# Patient Record
Sex: Male | Born: 1960 | Hispanic: Yes | Marital: Single | Smoking: Never smoker
Health system: Southern US, Community
[De-identification: ages and names within clinical notes are randomized; demographics above are authoritative.]

## PROBLEM LIST (undated history)

## (undated) DIAGNOSIS — E1110 Type 2 diabetes mellitus with ketoacidosis without coma: Secondary | ICD-10-CM

## (undated) DIAGNOSIS — I10 Essential (primary) hypertension: Secondary | ICD-10-CM

## (undated) DIAGNOSIS — S98112A Complete traumatic amputation of left great toe, initial encounter: Secondary | ICD-10-CM

## (undated) DIAGNOSIS — S88112A Complete traumatic amputation at level between knee and ankle, left lower leg, initial encounter: Secondary | ICD-10-CM

## (undated) DIAGNOSIS — S78112A Complete traumatic amputation at level between left hip and knee, initial encounter: Secondary | ICD-10-CM

## (undated) HISTORY — DX: Complete traumatic amputation of left great toe, initial encounter: S98.112A

## (undated) HISTORY — DX: Essential (primary) hypertension: I10

## (undated) HISTORY — DX: Type 2 diabetes mellitus with ketoacidosis without coma: E11.10

## (undated) HISTORY — DX: Complete traumatic amputation at level between knee and ankle, left lower leg, initial encounter: S88.112A

## (undated) HISTORY — DX: Complete traumatic amputation at level between left hip and knee, initial encounter: S78.112A

---

## 2010-06-10 DIAGNOSIS — E785 Hyperlipidemia, unspecified: Secondary | ICD-10-CM | POA: Insufficient documentation

## 2013-11-15 IMAGING — US US RUQ
1 series · 14 of 25 positions shown · non-contrast
Comparison: none

INDICATION: Right upper quadrant pain

[Series 1: us ruq · 14 of 37 slices shown]
[im 1/37]
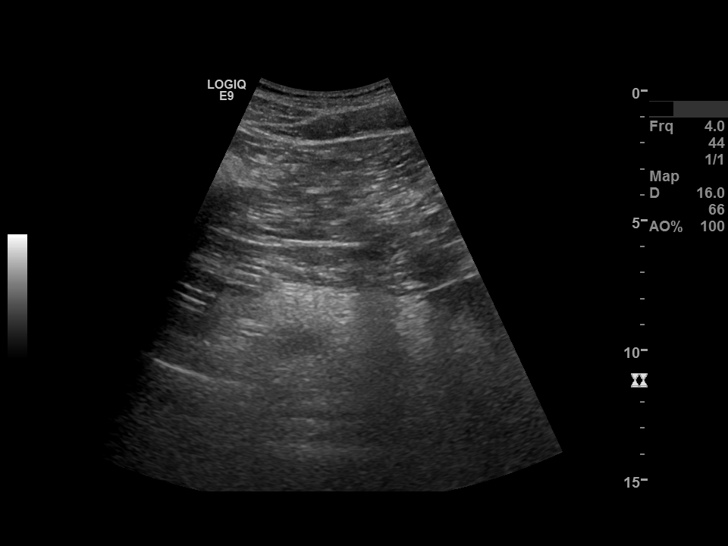
[im 4/37]
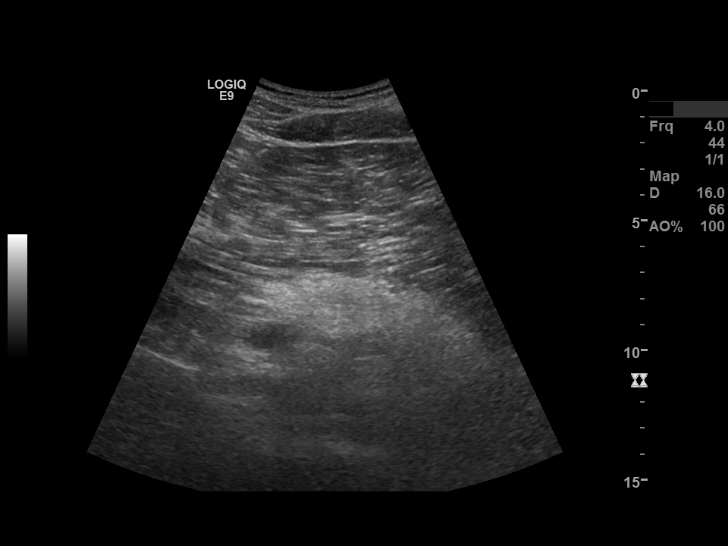
[im 7/37]
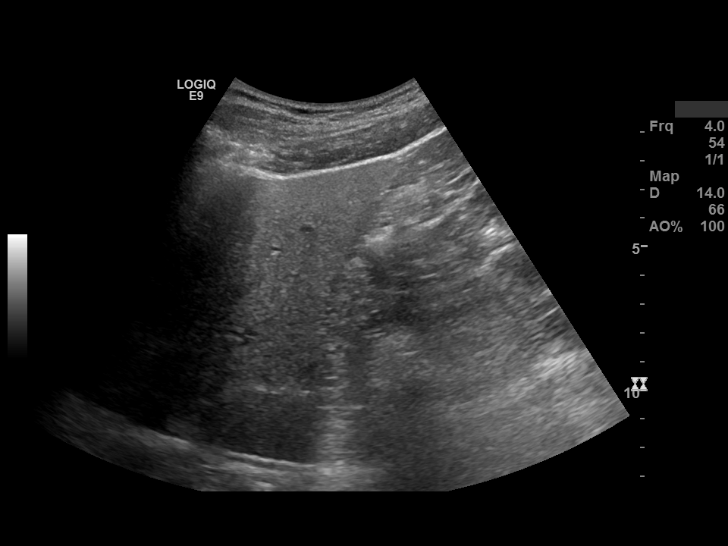
[im 10/37]
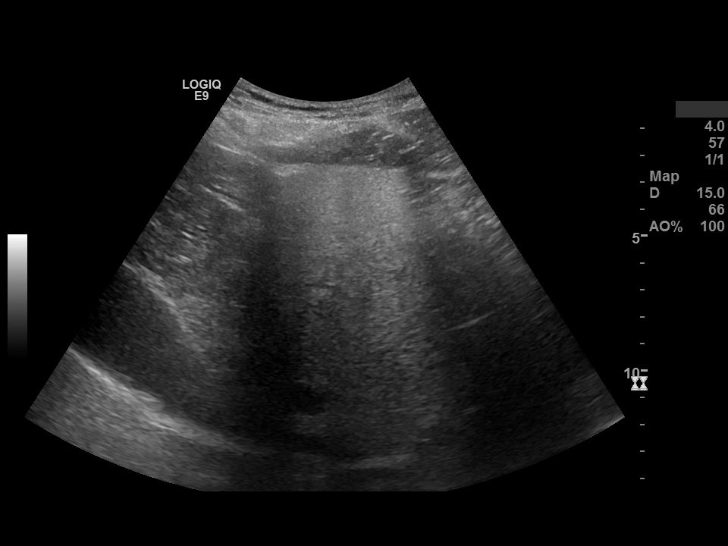
[im 13/37]
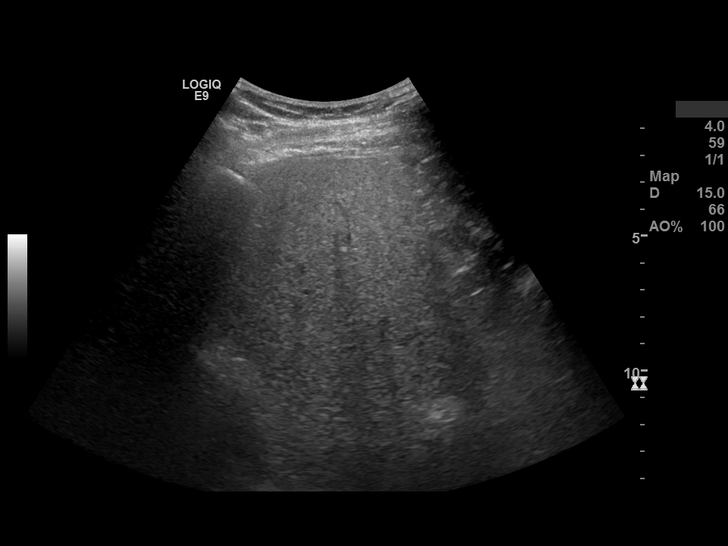
[im 14/37]
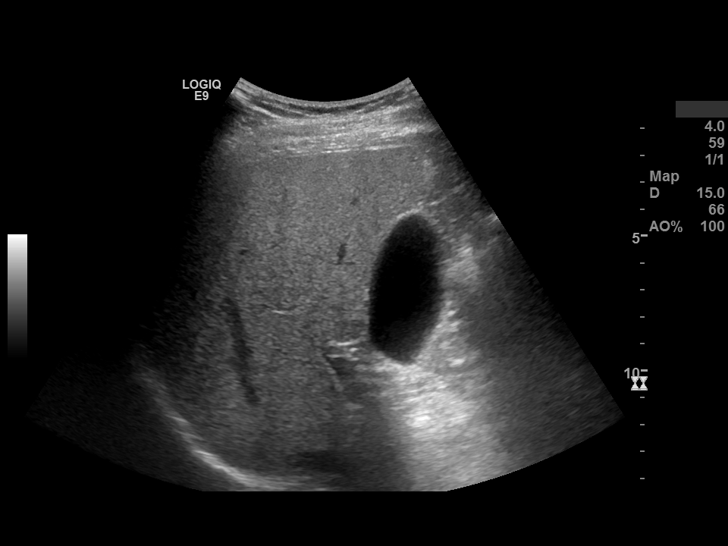
[im 17/37]
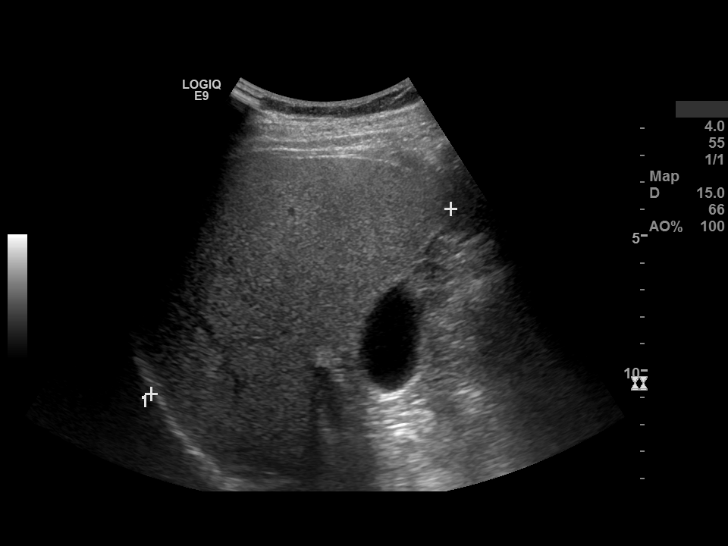
[im 20/37]
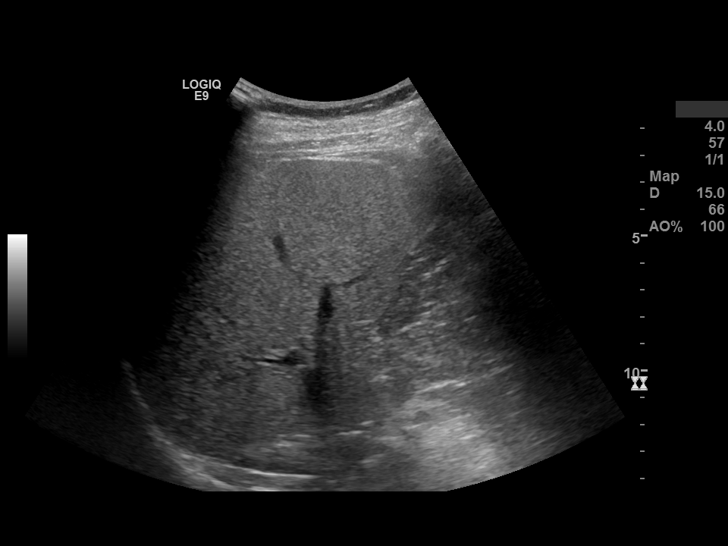
[im 23/37]
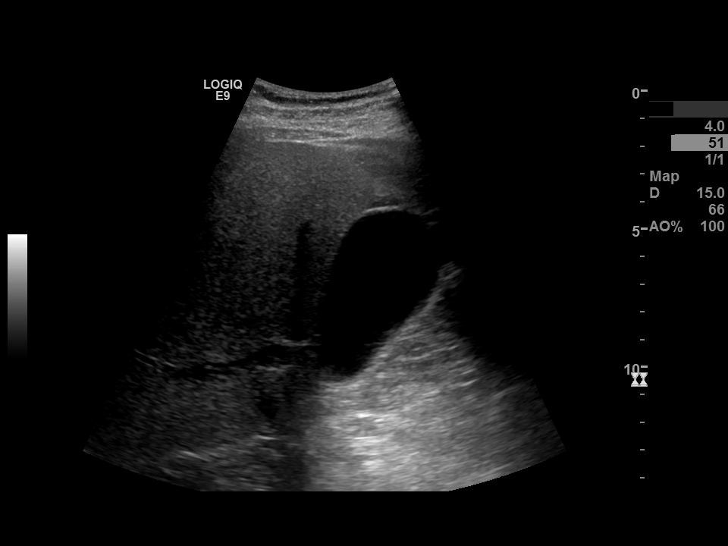
[im 25/37]
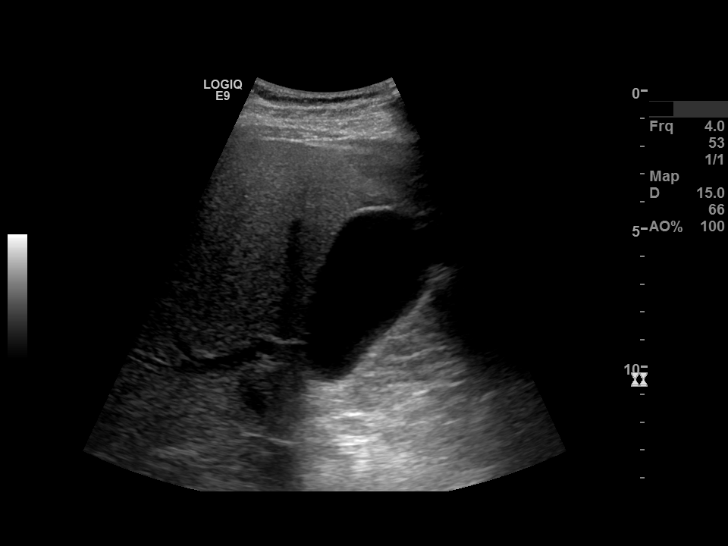
[im 28/37]
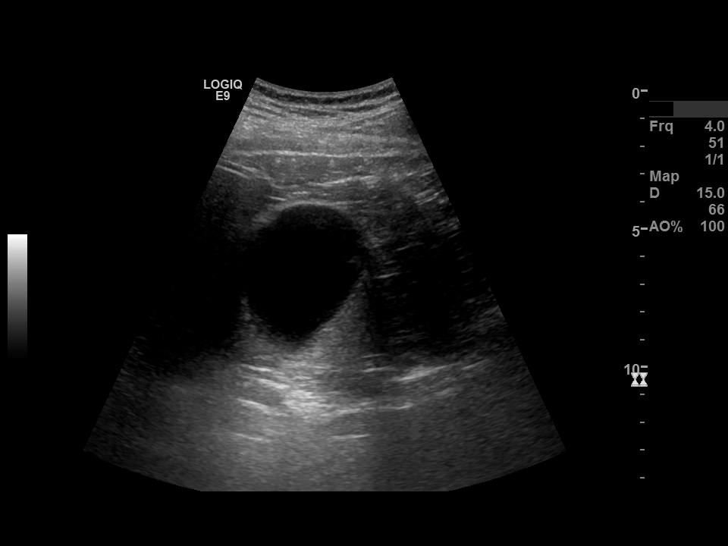
[im 31/37]
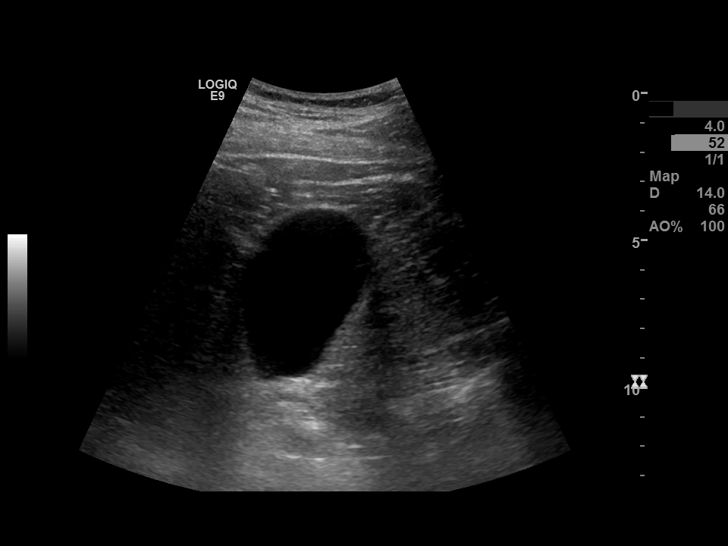
[im 34/37]
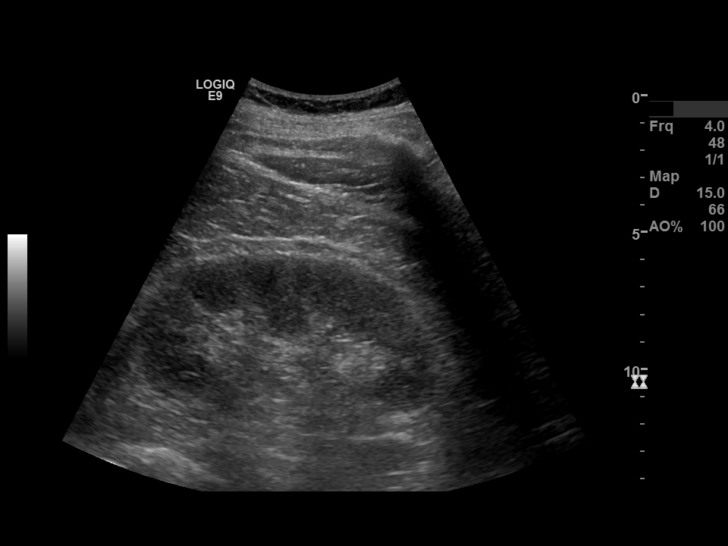
[im 37/37]
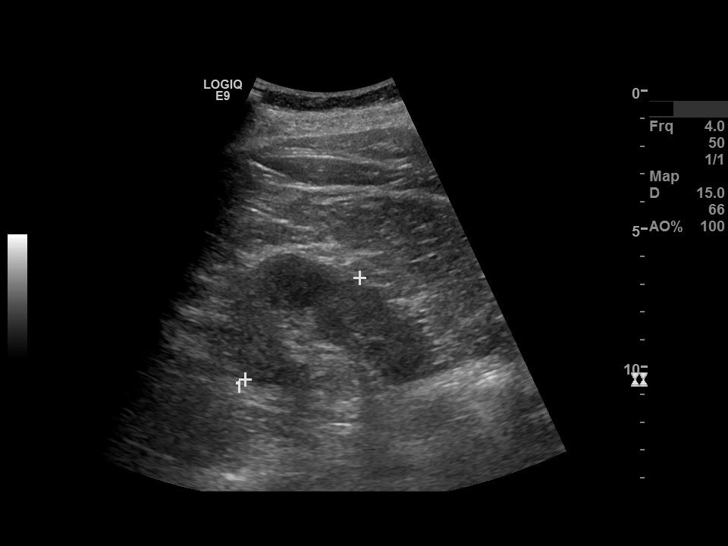

[14 of 25 positions shown; findings below may reference images not displayed]

FINDINGS: Ultrasound examination of the gallbladder shows no gallstones, gallbladder wall thickening or pericholecystic fluid. Common bile duct is normal in diameter at 5 mm. There is increased echogenicity within the liver consistent with hepatic steatosis. No focal liver lesion is demonstrated. There is no right hydronephrosis.
IMPRESSION: 1. Normal-appearing gallbladder

2. Hepatic steatosis

## 2019-01-11 IMAGING — CR HIP BIL 5 VWS W/PELVIS
2 series · 5 of 5 positions shown · non-contrast
Comparison: none

Pelvis and bilateral hips 5 views
INDICATION: Pain in hip.

[Series 1: ap · 0.17mm/px · 4 of 4 slices shown (1 of 2)]
[im 1/4]
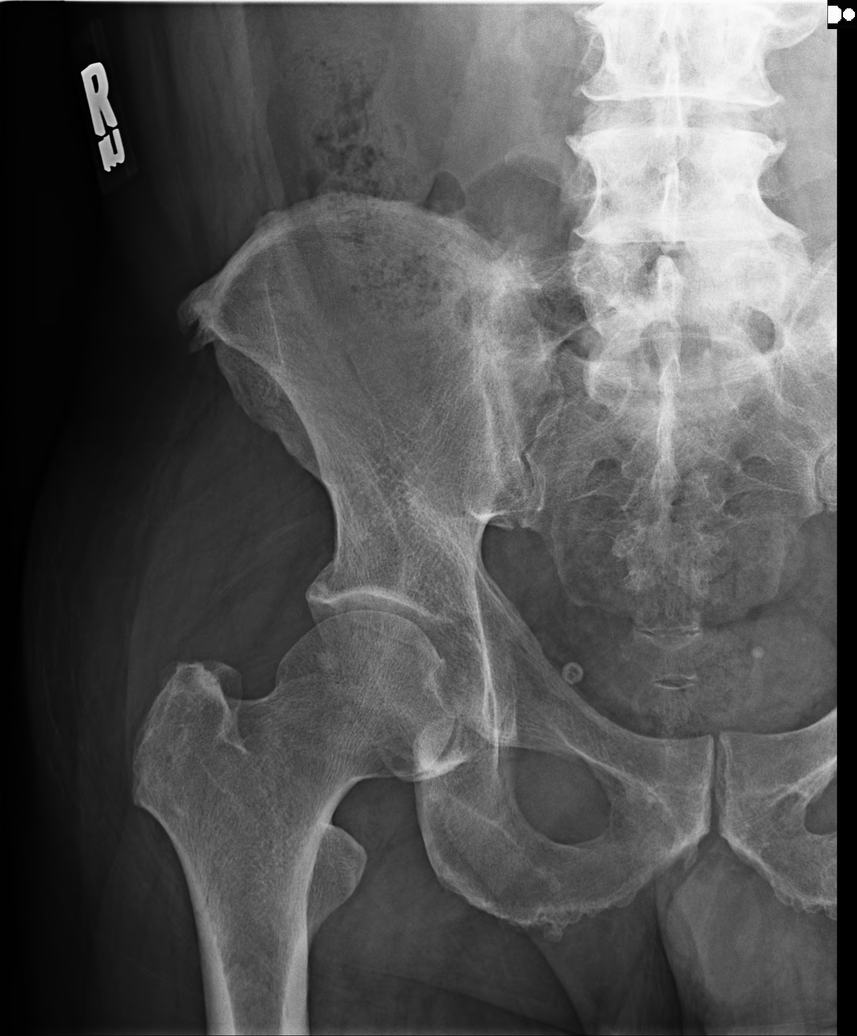
[im 2/4]
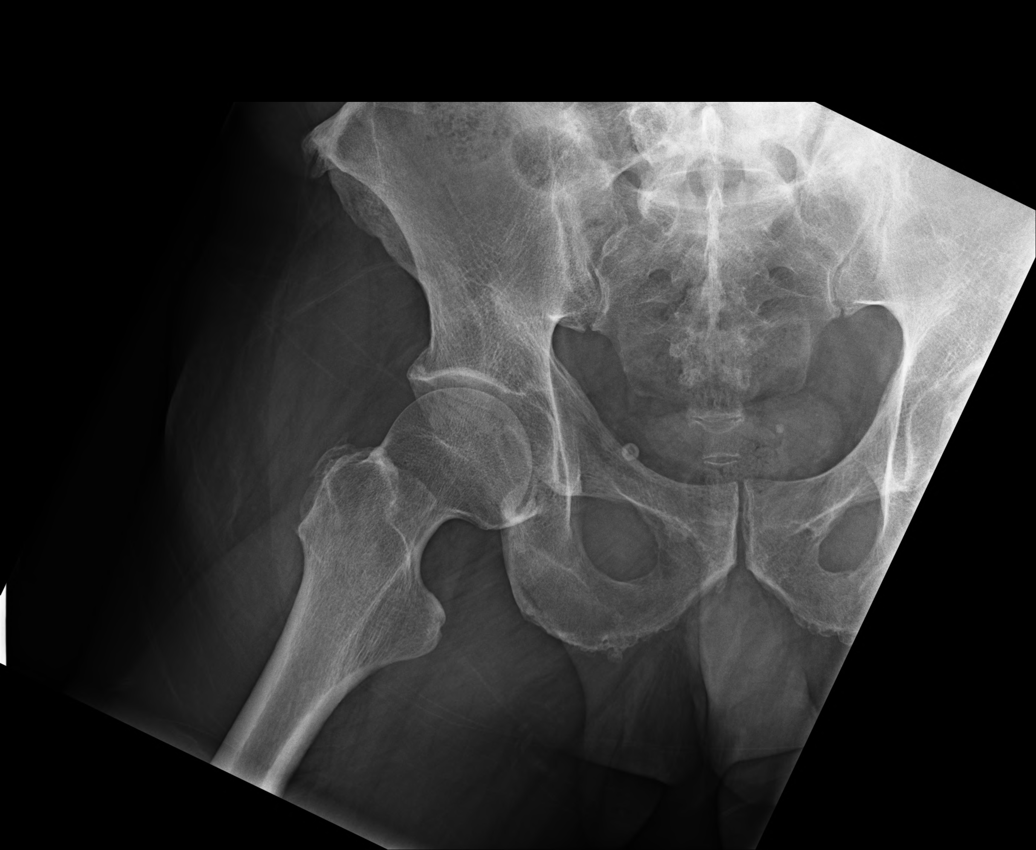
[im 3/4]
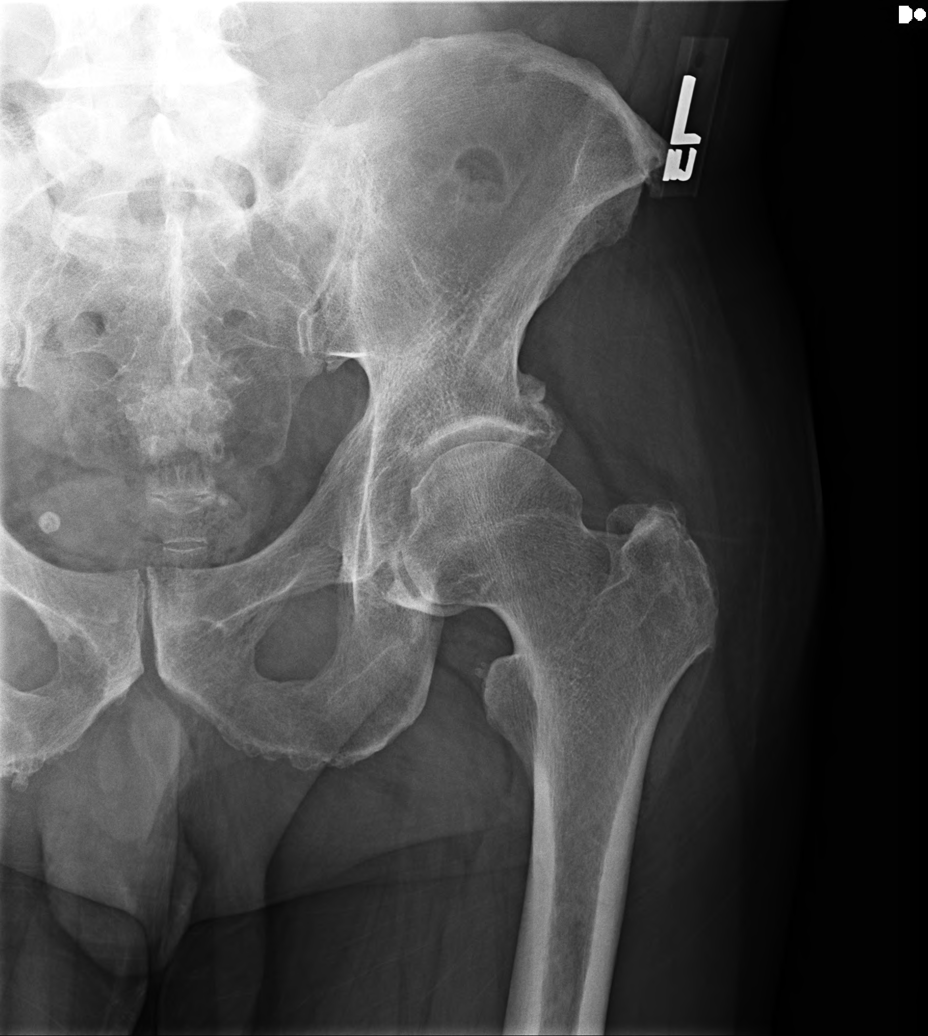
[im 4/4]
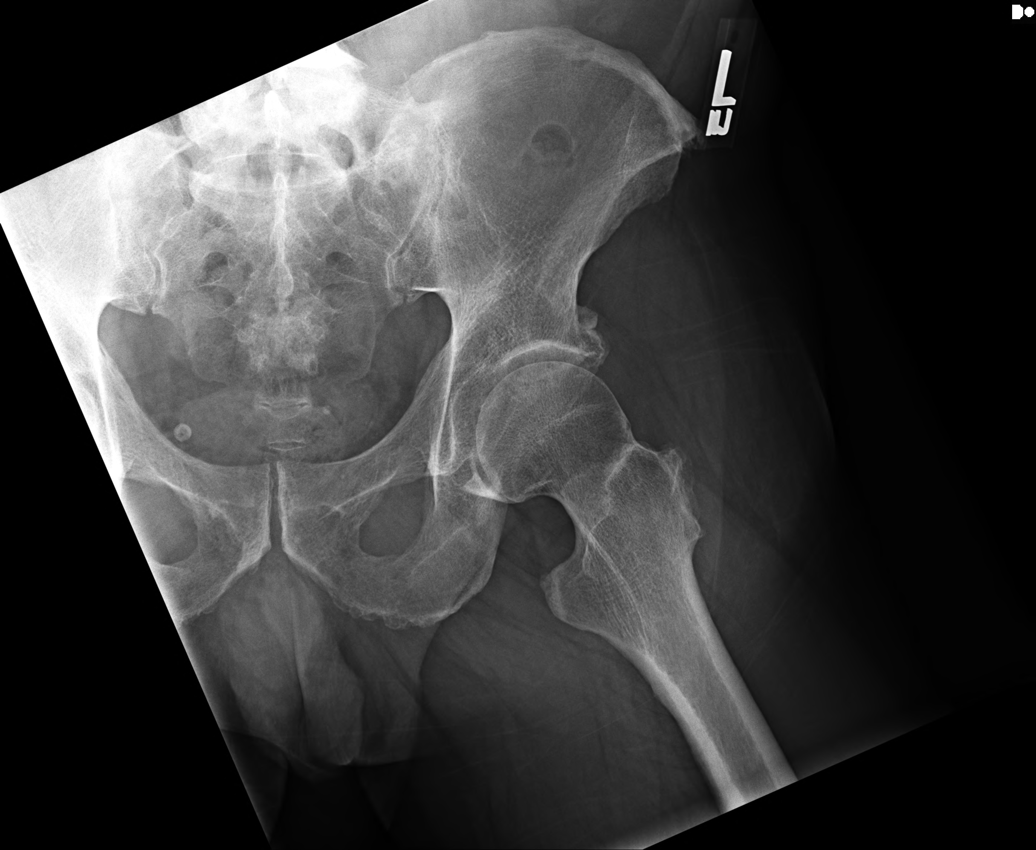

[ap (2 of 2)]
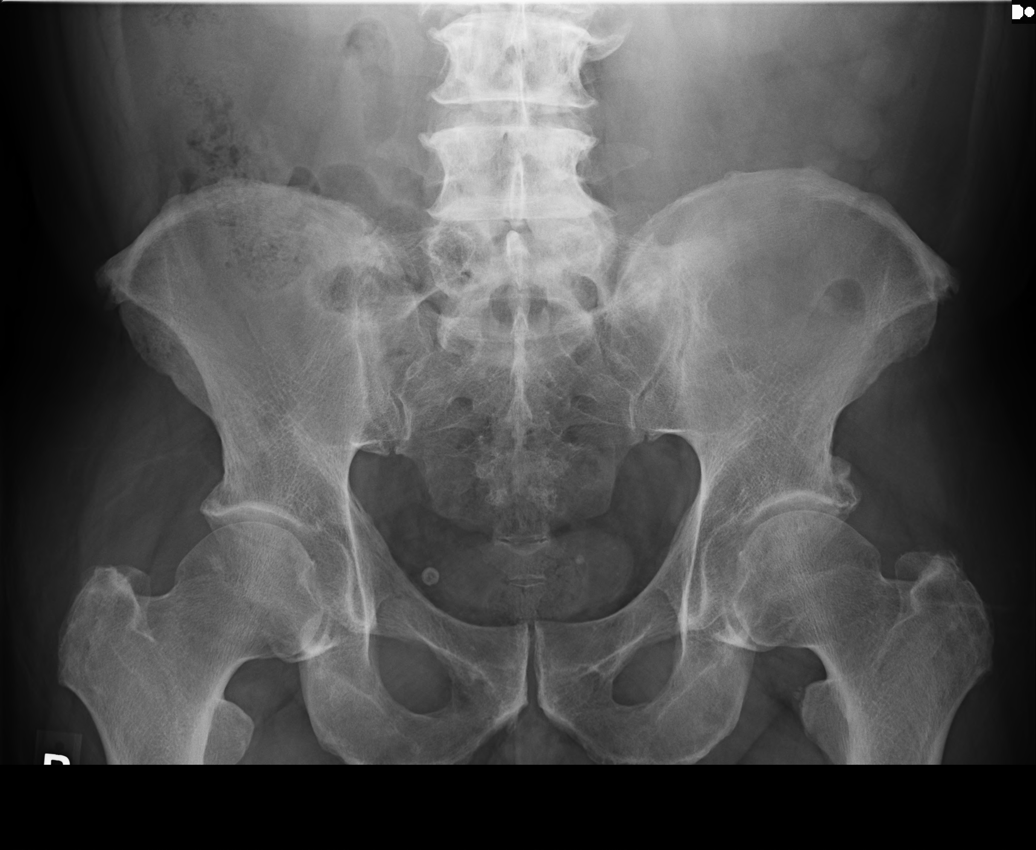

[5 of 5 positions shown; findings below may reference images not displayed]

FINDINGS: There are bilateral mild inferior acetabular spurs. Tiny bilateral areas of upper SI joint partial fusions.

Bone mineralization otherwise unremarkable. Moderate lumbar degenerative change.
IMPRESSION: Mild bilateral hip degenerative change. Tiny areas of partial SI joint fusions bilaterally.

STAT Fax

## 2020-06-18 IMAGING — US US ABDOMEN COMPLETE
1 series · 13 of 25 positions shown · non-contrast
Comparison: Previous right upper quadrant ultrasound 11/15/2013.

HISTORY: 59-year-old male, elevated blood work.
TECHNIQUE: Abdominal ultrasound.

[Series 1: us abdomen complete · 13 of 62 slices shown]
[im 1/62]
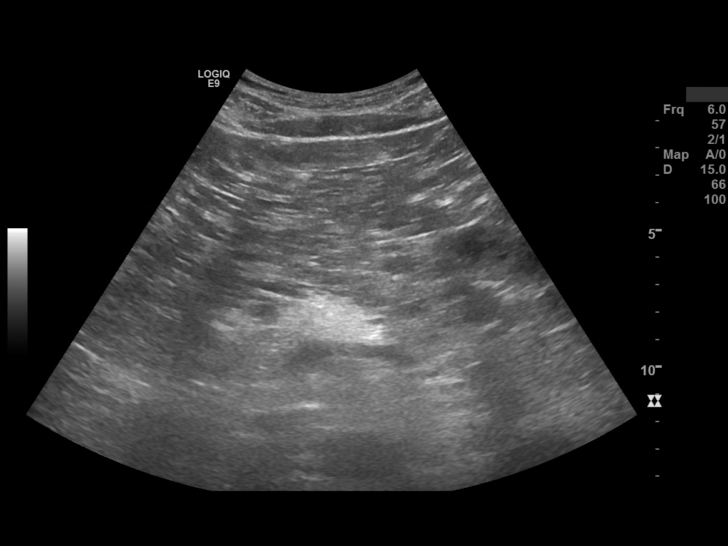
[im 6/62]
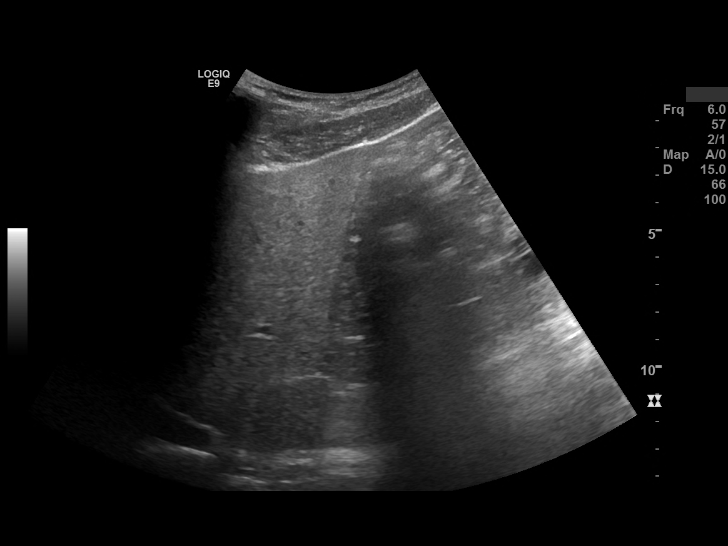
[im 11/62]
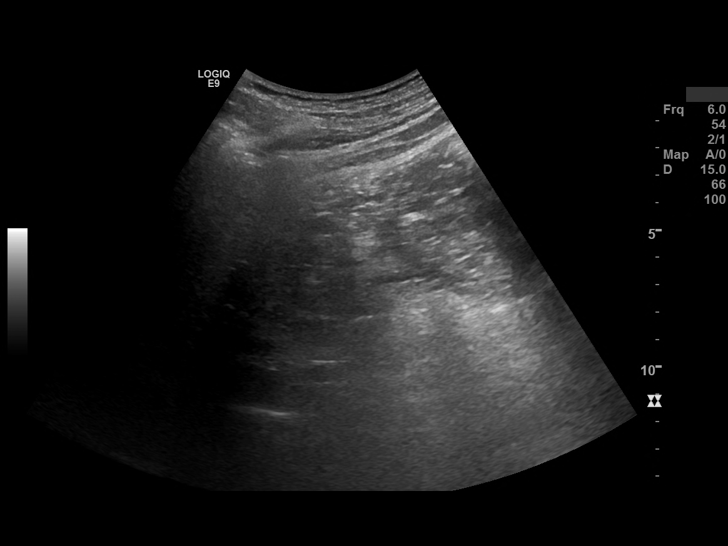
[im 16/62]
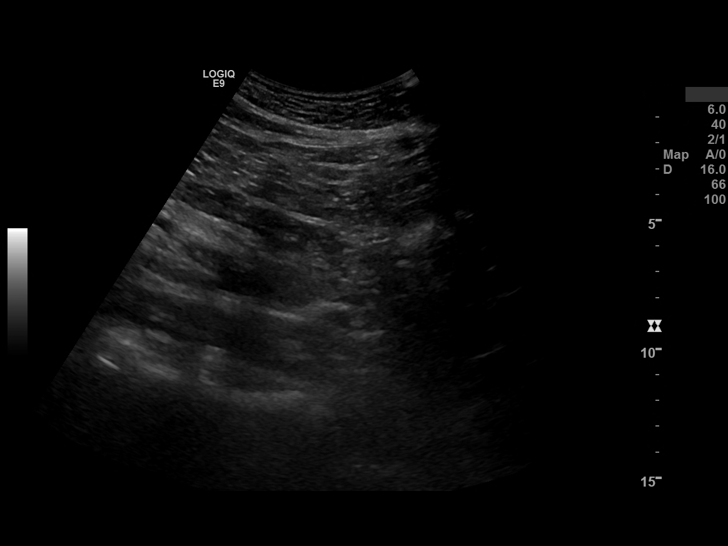
[im 21/62]
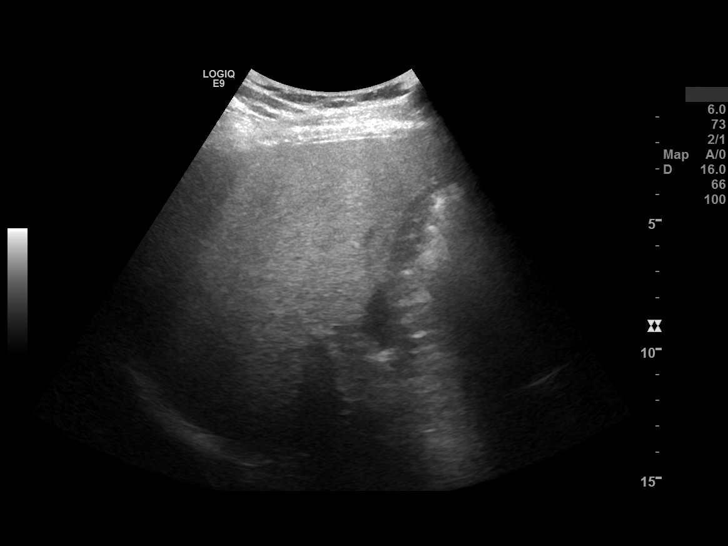
[im 26/62]
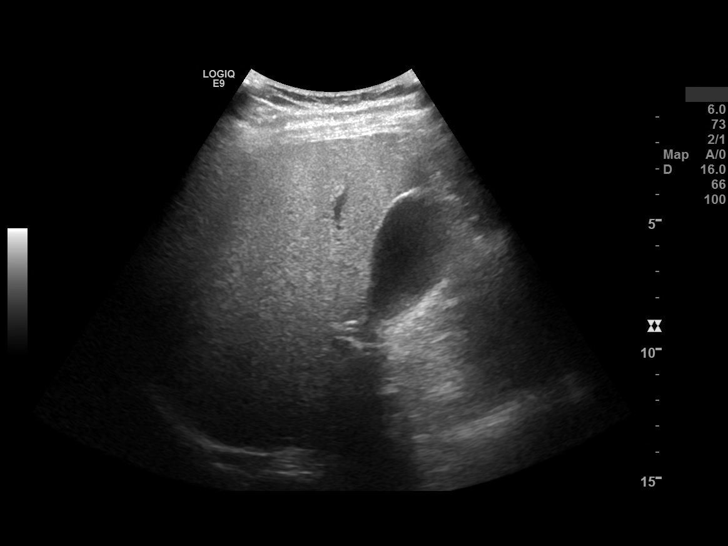
[im 31/62]
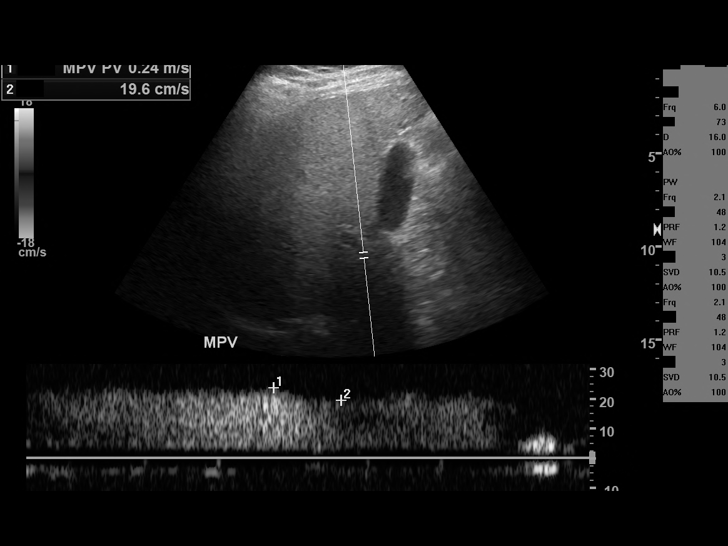
[im 36/62]
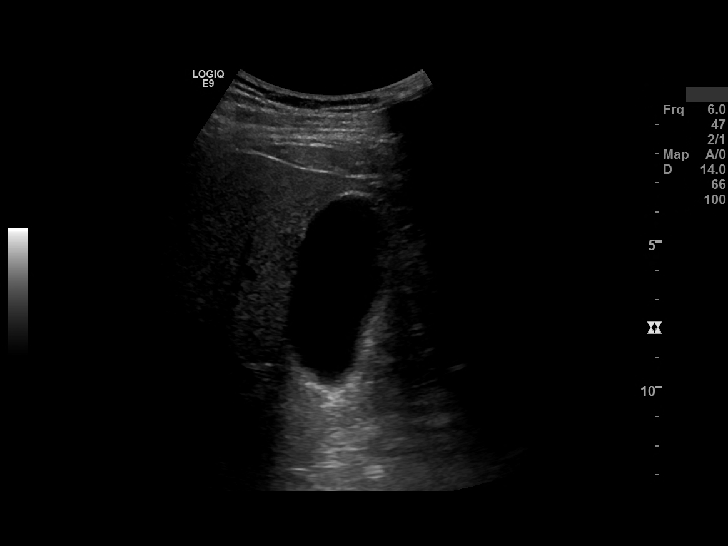
[im 41/62]
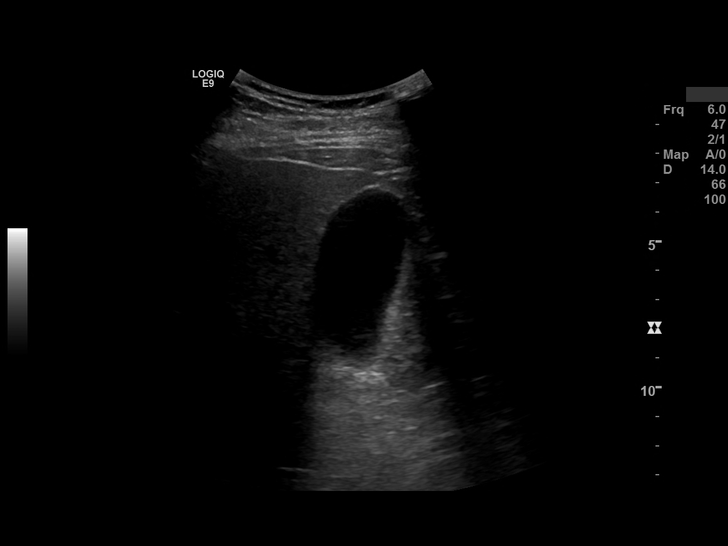
[im 46/62]
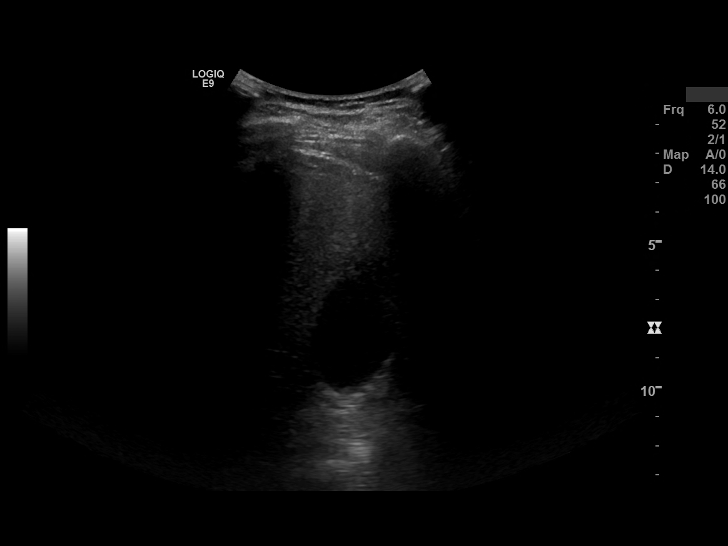
[im 51/62]
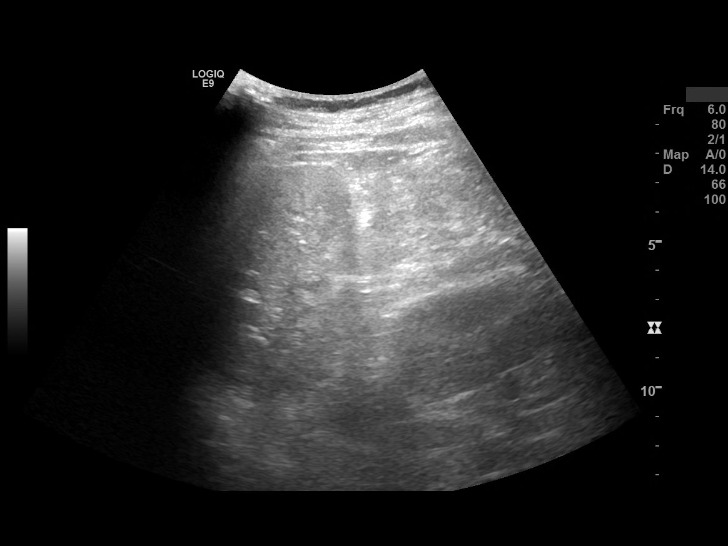
[im 56/62]
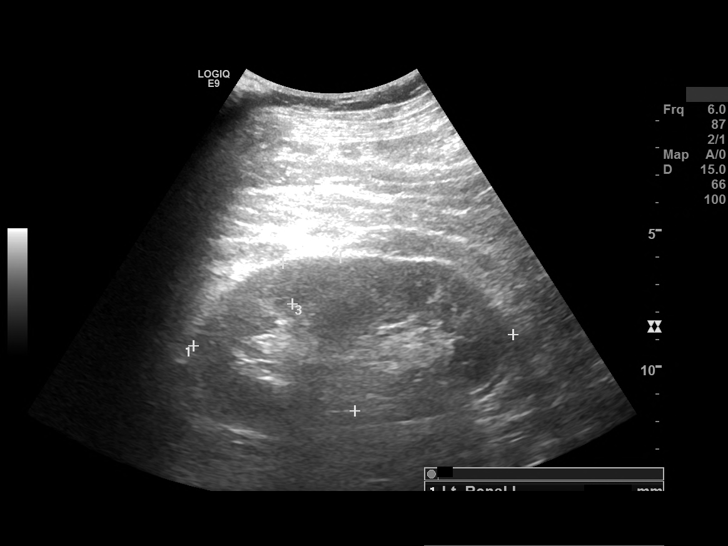
[im 62/62]
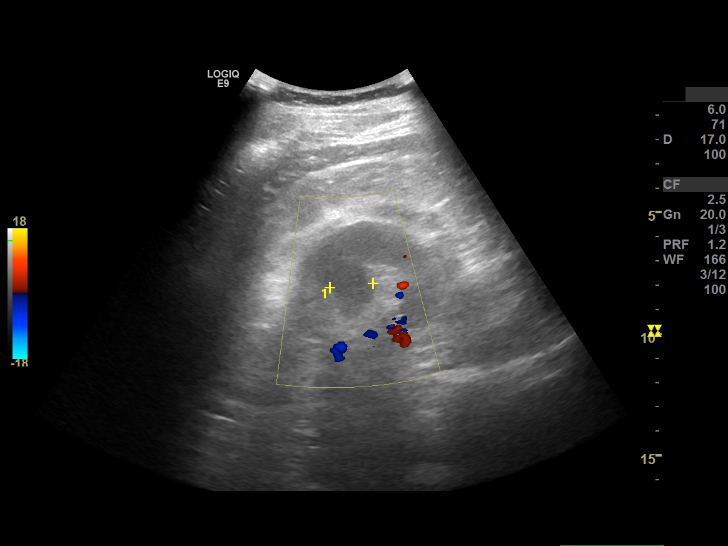

[13 of 25 positions shown; findings below may reference images not displayed]

FINDINGS: Right lobe of liver 145 mm in length. Liver echogenicity is moderately echo bright compared to kidney. Echodense linear surface. No liver mass, no ascites.

IVC patent.

Portal vein flow hepatopetal measuring 6.4 mm. Flow rate 0.24 m/s.

Gallbladder shows no stones or sludge.

Gallbladder wall normal 1.7 mm. No pericholecystic fluid.

Common duct normal 5.7 mm.

Abdominal aorta shows no aneurysm.

The visualized portions of pancreas normal but mostly obscured by bowel gas and not seen well.

Spleen is 80 x 51 x 37 mm. Appears normal.

Right kidney 110 x 54 x 91 mm,  no hydronephrosis.

Left kidney 117 x 56 x 93 mm. Slightly lobular surface, prominent pyramid region as well is likely a prominent column of 13 column of 13, 13 13 x 13 about 18 x 18 mm. However, correlation should be made with renal CT.
IMPRESSION: 1. No gallstones, no duct dilatation.

2. Liver is echogenic echo bright likely cyst, there is a mild degree of steatosis/steatohepatitis.

3. Left kidney area with focal lobulation of 18 mm is likely a column of Bertin, recommend CT of kidneys pre- and postcontrast. 

4. Other details described as above.

## 2020-06-18 IMAGING — CT CTA HEAD/NECK WO/W CONTRAST
2 of 16 series · 8 of 33 positions shown · IV contrast (APPLIED)
Comparison: None.

HISTORY: 59-year-old male with elevated alkaline phosphate level, elevated ALT measurement, essential hypertension and mixed hyperlipidemia.
TECHNIQUE: CT angiography was obtained from the aortic arch to the top of the skull first without contrast followed by the intravenous administration of nonionic contrast material. Coronal, sagittal, and bilateral oblique reconstructions were obtained.  Multiplanar MIP reconstructions were created. 2-D and 3-D reconstructed images also obtained. Post-processing software generated rotating 3-D and MIP images performed under concurrent physician supervision.

IV contrast: 80 mL 3sovue-Y11. Serum creatinine is 0.9 mg/dL by i-STAT.

[Series 11: angio neck · axial · 0.53mm/px · z∈[-368,-138]mm · 4 of 386 slices shown]
[im 78/386  soft-tissue]
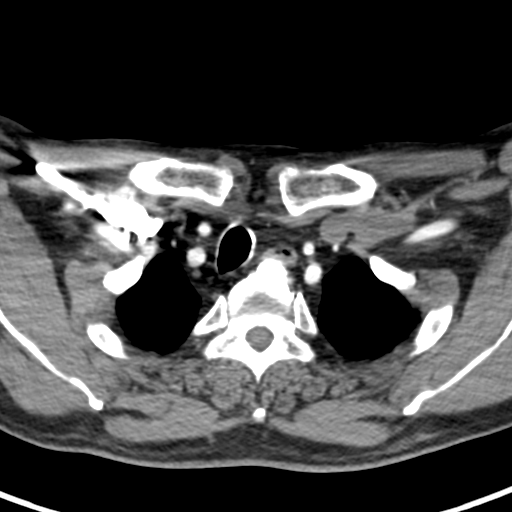
[im 155/386  bone]
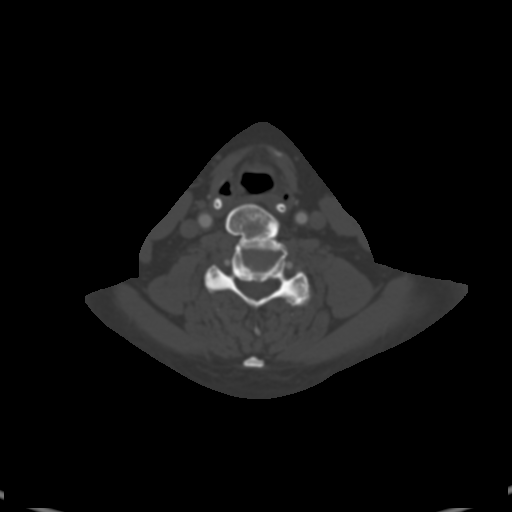
[im 232/386  soft-tissue]
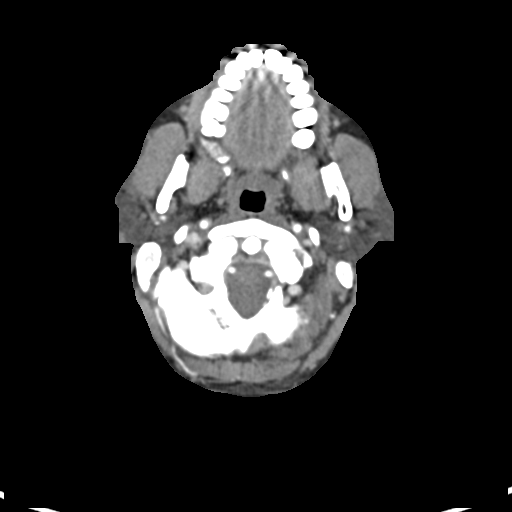
[im 309/386  bone]
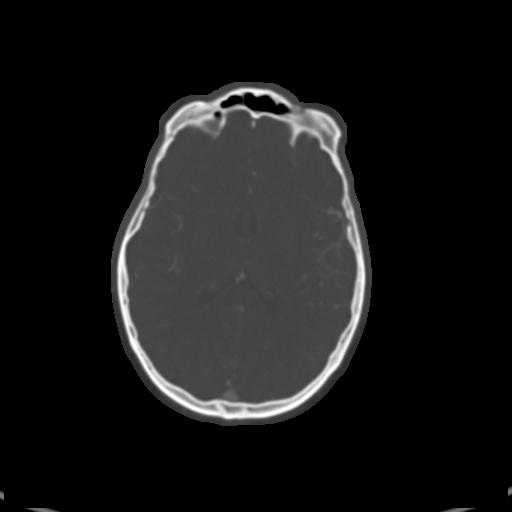

[Series 12: angio head · axial · 0.53mm/px · z∈[-192,-93]mm · 4 of 332 slices shown]
[im 67/332  soft-tissue]
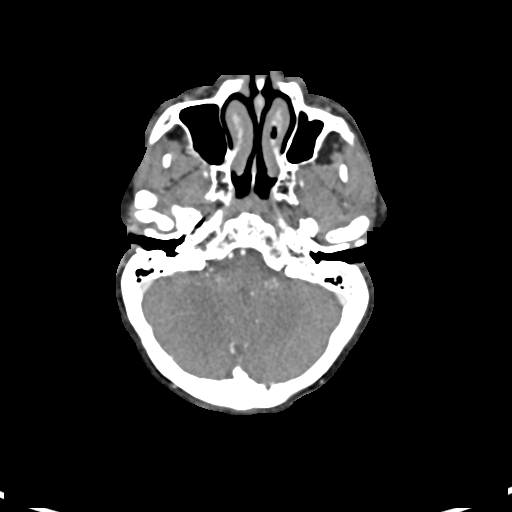
[im 133/332  soft-tissue]
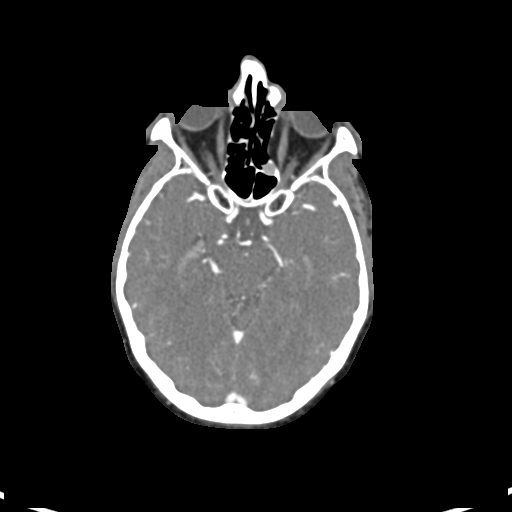
[im 199/332  soft-tissue]
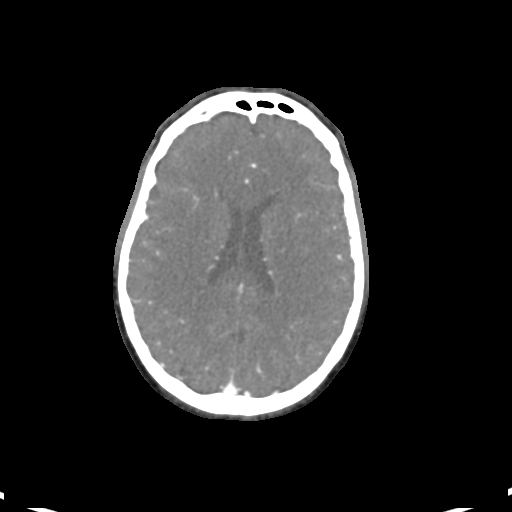
[im 265/332  soft-tissue]
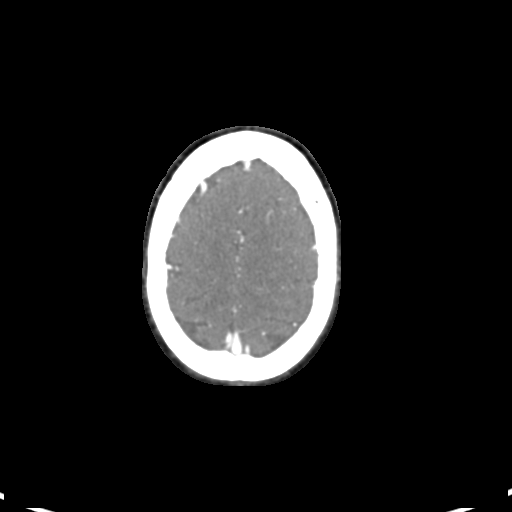

[8 of 33 positions shown; findings below may reference images not displayed]

FINDINGS: Aortic arch: The origins of the brachiocephalic, left common carotid  and the left subclavian arteries are patent.

Vertebral arteries: The right vertebral artery is patent.  The left vertebral artery is also patent.

Right common carotid arteriogram: The right common carotid and the right external carotid arteries are patent. There is mild stenosis involving the origin of the right internal carotid artery.

Left common carotid arteriogram: The left common carotid, left internal and the left external carotid arteries are also patent.

Visualized portion of the sinuses: Mild mucosal thickening is present in the ethmoid air cells.

Thyroid and the salivary glands: The thyroid gland is unremarkable. The parotid, submandibular glands are also within normal limits.

Lymph nodes: Nonspecific bilateral jugulodigastric and bilateral submandibular lymph nodes are present.

Cervical spine: Thickened bridging osteophytes are present throughout the cervical spine from C3 to C7 levels. Broad-based anterior bridging osteophytes are also present at C2-3 and T1-2 levels.

Larynx: The larynx is unremarkable.

Mandible: The visualized portion mandible is unremarkable. 

Nasopharynx and oropharynx: The nasopharynx and the oropharynx are unremarkable.

Both lung apices are unremarkable.

2-D and 3-D reconstructed images confirmed the above findings.
IMPRESSION: 1. There is mild stenosis involving the origin of the right internal carotid artery. The remaining neck vessels are patent. DISH changes involving the cervical spine.

3. Mild mucosal thickening is present in the ethmoid air cells.

CT ANGIOGRAPHY STUDY OF THE HEAD.
FINDINGS: Petrous and cavernous portion of both internal carotid arteries: The petrous and cavernous portions of both internal carotid arteries are patent.

Right anterior and middle cerebral arteries: Patent.

Left anterior and middle cerebral arteries: Patent. 

Distal vertebral arteries: Patent.

Basilar artery and both posterior cerebral arteries: Patent.

Brain parenchyma: The brain parenchyma is within normal limits.

Bones and soft tissues are also unremarkable.

2-D and 3-D reconstructed images confirm the above findings.
IMPRESSION: 1. Patent major intracranial vessels.

2. Please note that aneurysms 5 mm or smaller may not be detected by this technique secondary to its limits of resolution.

Total radiation dose to patient is CTDIvol 113.55 mGy and DLP 2208.92 mGy-cm.

## 2020-06-19 IMAGING — CR SHOULDER RT 2-3 VWS
1 series · 3 of 3 positions shown · non-contrast
Comparison: Correlation made to concurrent right shoulder MRI.

INDICATION: Bilateral shoulder pain
TECHNIQUE: 3 views of the right shoulder obtained.

[Series 1: internal rotate · 0.17mm/px · 3 of 3 slices shown]
[im 1/3]
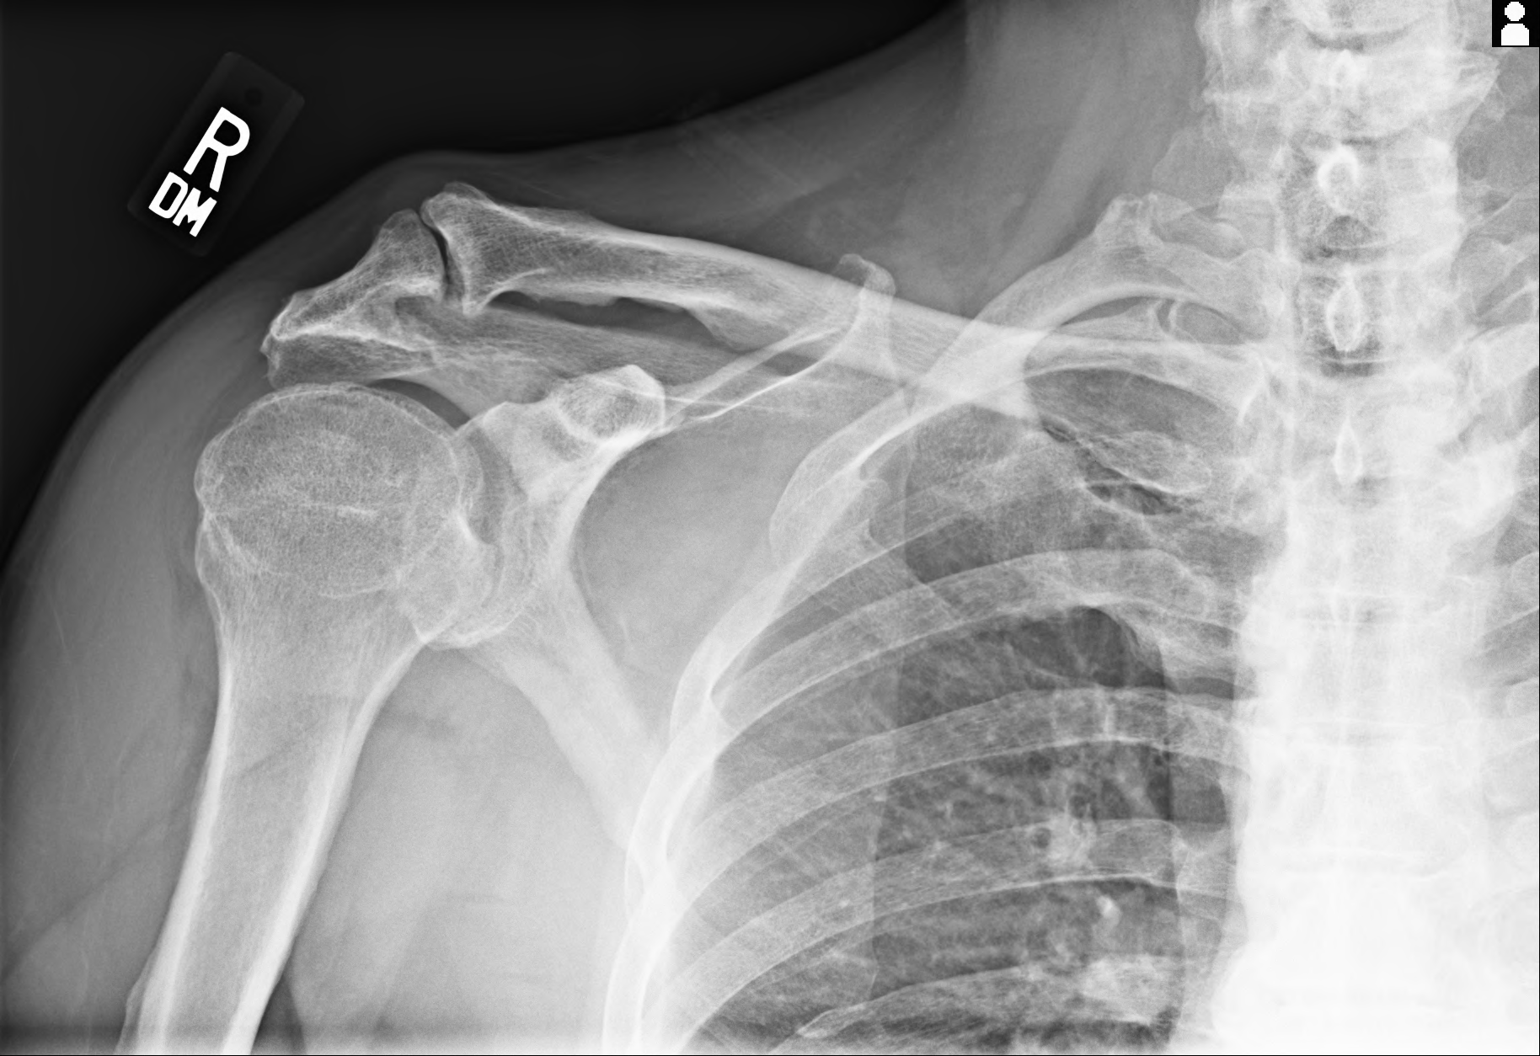
[im 2/3]
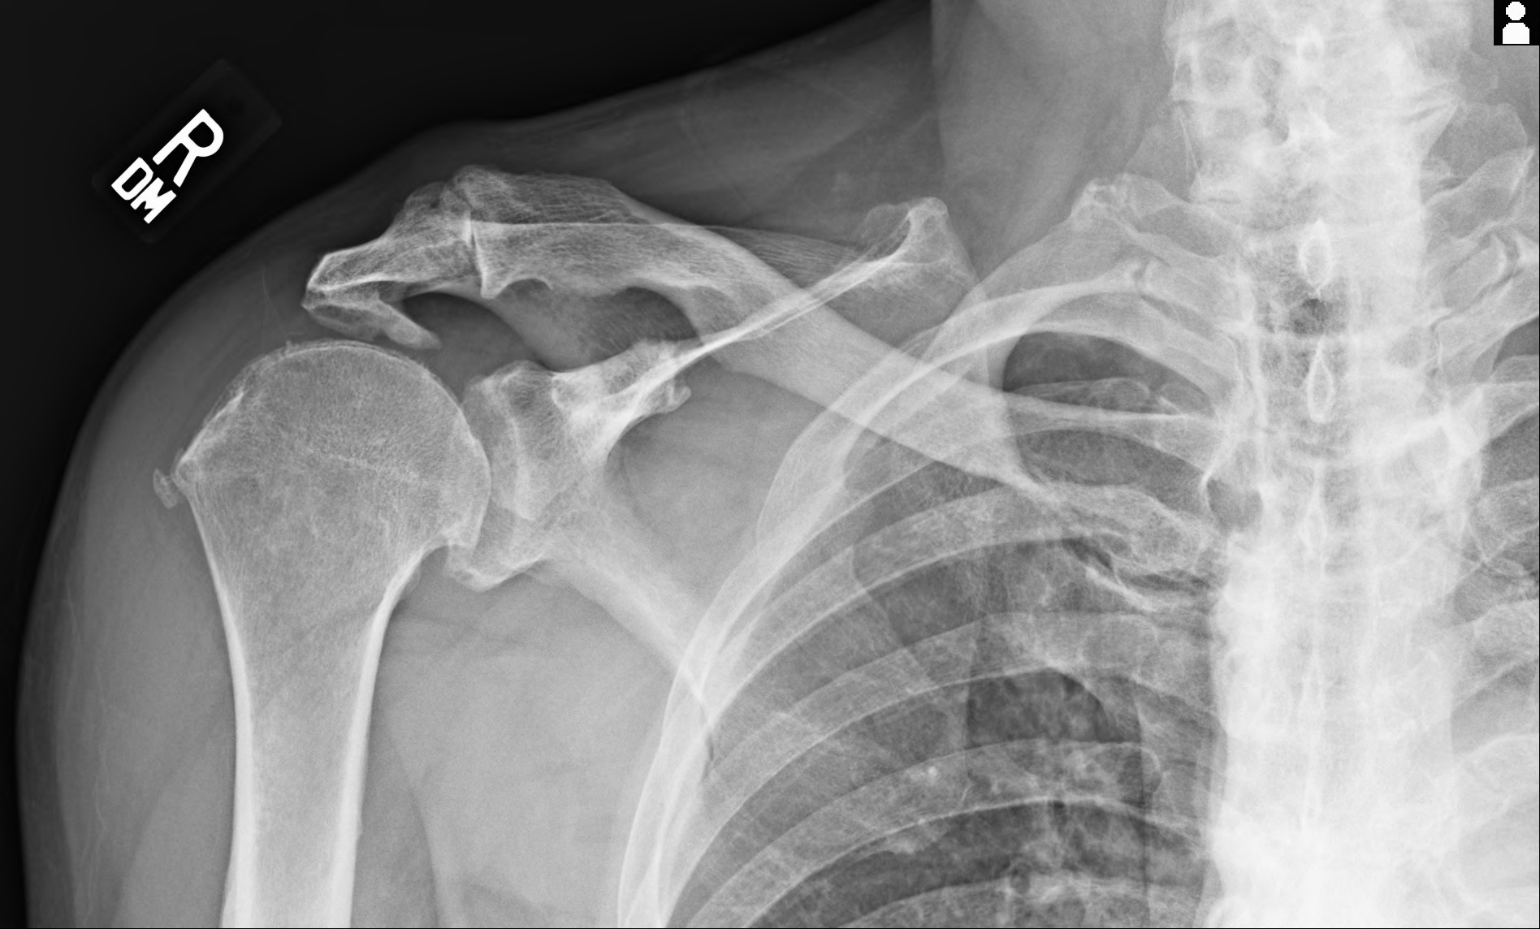
[im 3/3]
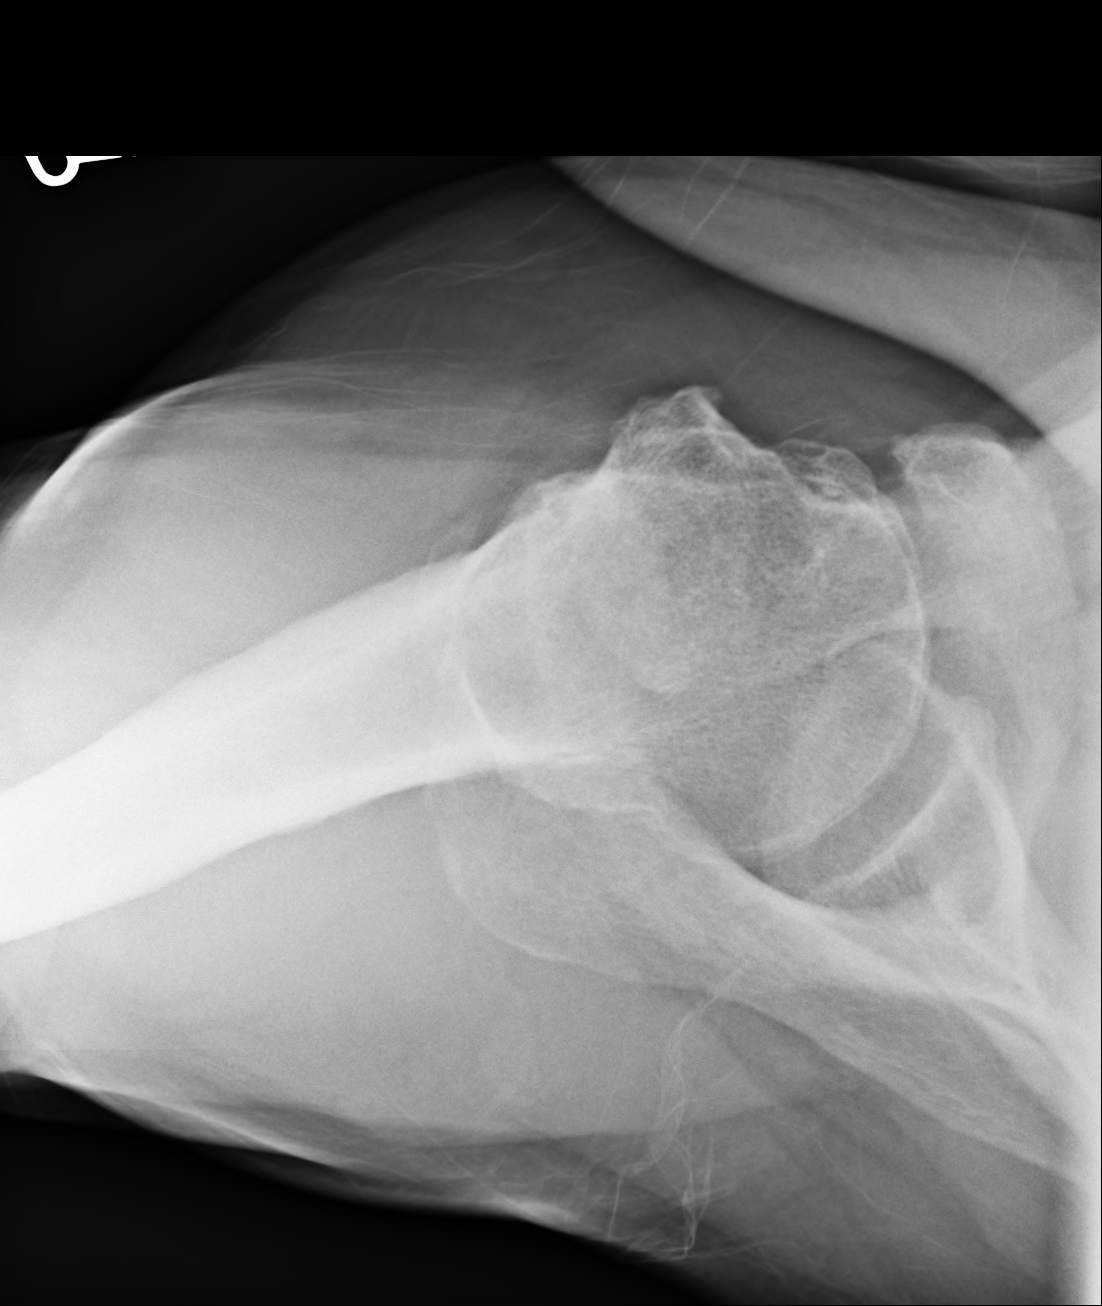

[3 of 3 positions shown; findings below may reference images not displayed]

FINDINGS: No acute fracture or dislocation. Moderate to severe acromioclavicular joint osteoarthritis. Large subacromial enthesophyte. Moderate glenohumeral joint osteoarthritis. Visualized portion of the right chest is unremarkable. Reactive changes are noted along the greater tuberosity, which can be seen in setting of rotator cuff pathology.
IMPRESSION: Moderate to severe acromioclavicular and moderate glenohumeral joint osteoarthritis.

## 2020-06-19 IMAGING — MR MRI SHOULDER LT WO CONTRAST
4 series · 29 of 40 positions shown · non-contrast
Comparison: None available.

INDICATION: Primary osteoarthritis and pain, left shoulder.
TECHNIQUE: Multiplanar, multisequence MR imaging of the left shoulder without contrast.

[Series 5: t2_axial_fs · axial · left · 3.0mm · 0.47mm/px · z∈[+12,+104]mm · 8 of 24 slices shown]
[im 1/24]
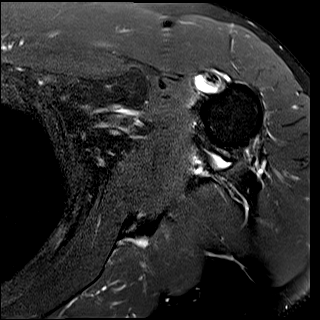
[im 4/24]
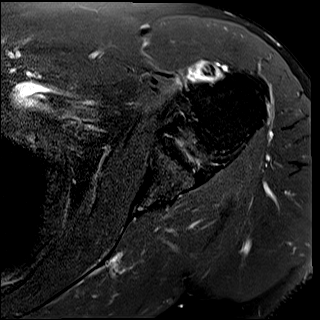
[im 7/24]
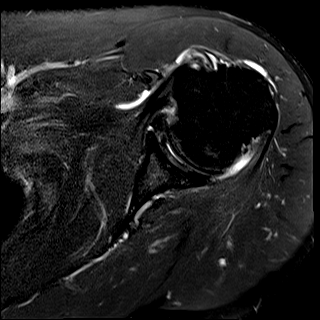
[im 10/24]
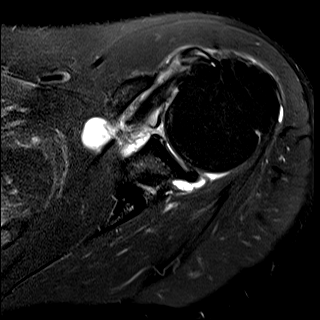
[im 14/24]
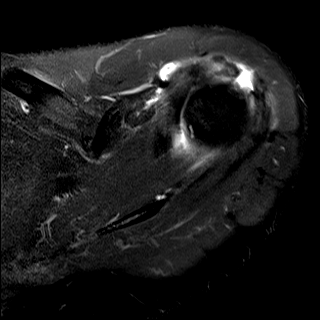
[im 17/24]
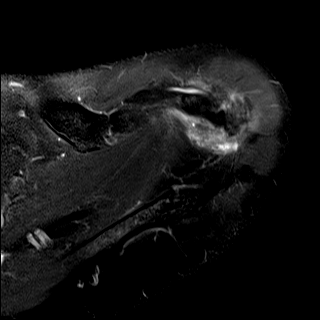
[im 20/24]
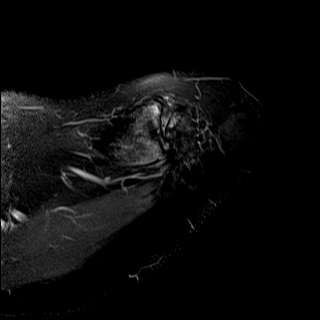
[im 24/24]
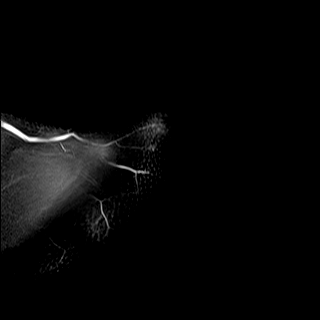

[Series 6: t2_cor_fs · coronal · left · 3.0mm · 0.48mm/px · 8 of 25 slices shown]
[im 1/25]
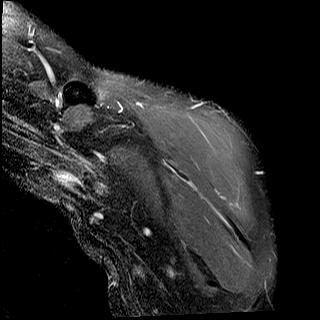
[im 3/25]
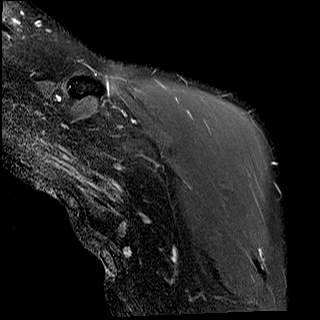
[im 9/25]
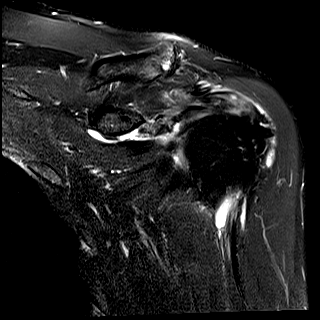
[im 11/25]
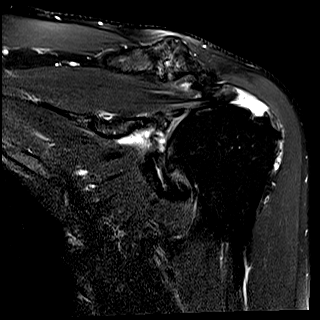
[im 14/25]
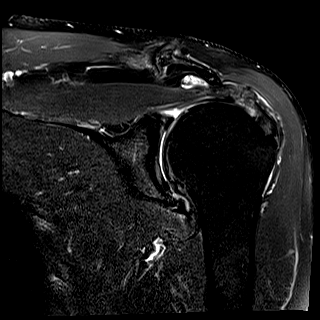
[im 17/25]
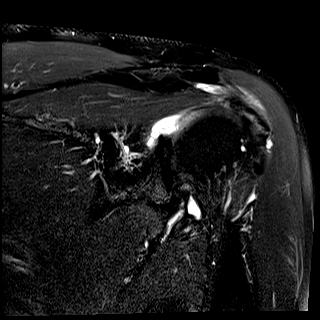
[im 22/25]
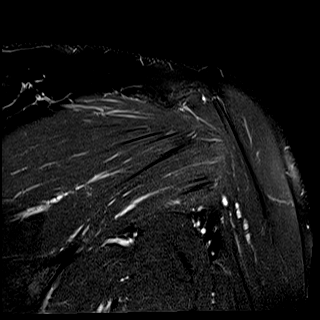
[im 25/25]
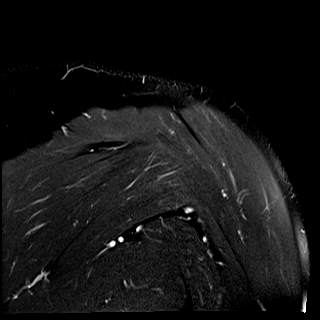

[Series 7: t2_sag_fs · sagittal · left · 3.0mm · 0.29mm/px · 10 of 28 slices shown]
[im 1/28]
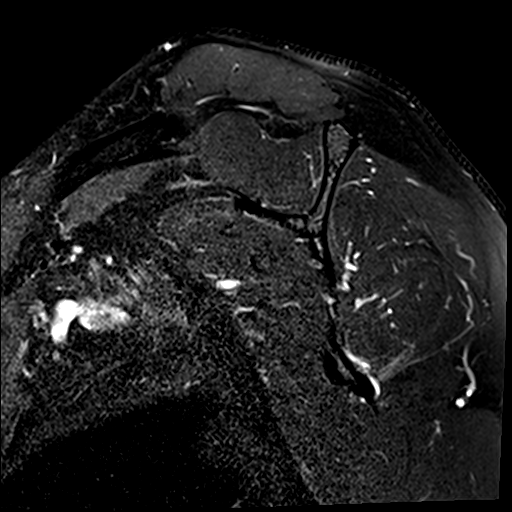
[im 3/28]
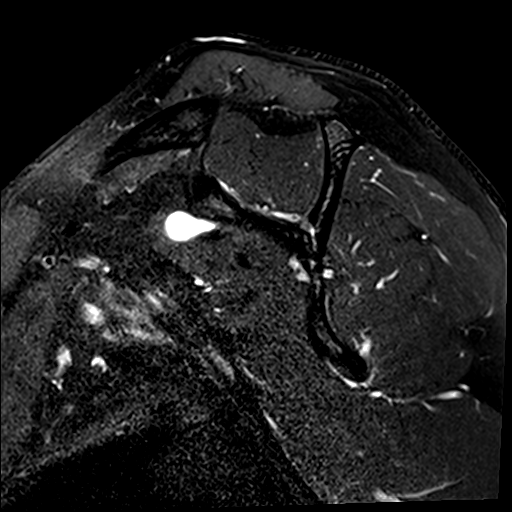
[im 6/28]
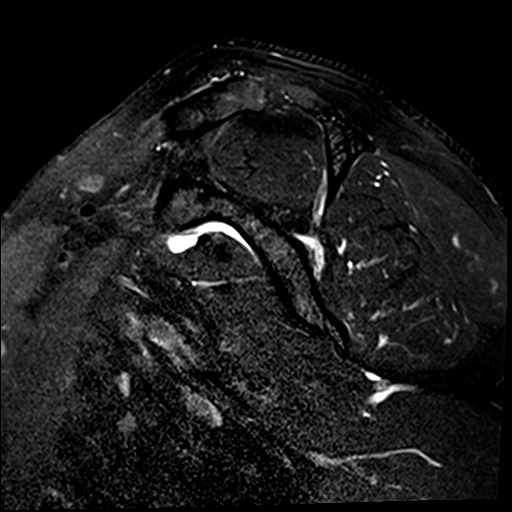
[im 9/28]
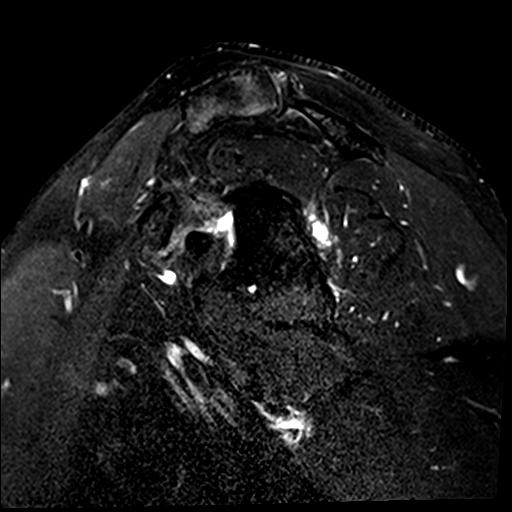
[im 11/28]
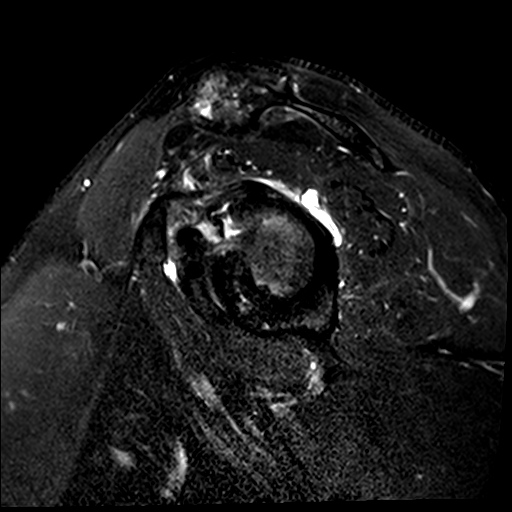
[im 14/28]
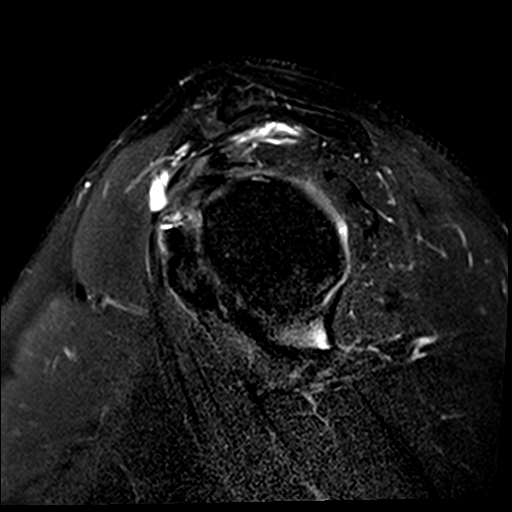
[im 17/28]
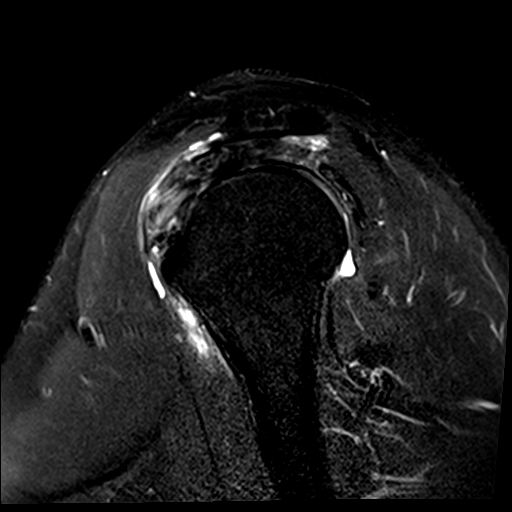
[im 19/28]
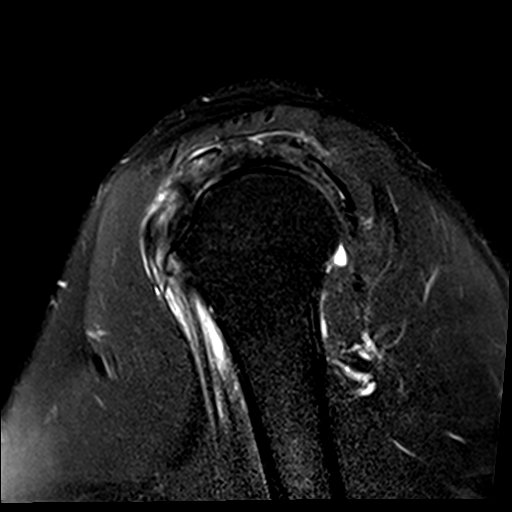
[im 22/28]
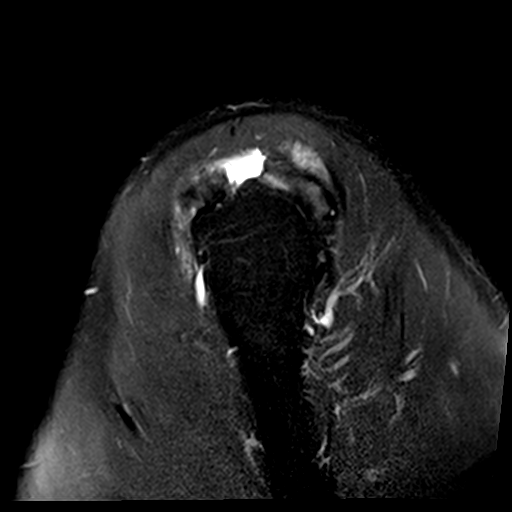
[im 25/28]
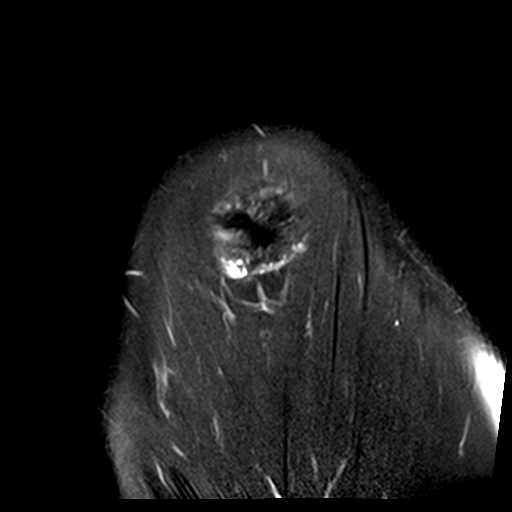

[Series 8: t1_sag · sagittal · left · 3.0mm · 0.47mm/px · 3 of 28 slices shown]
[im 3/28]
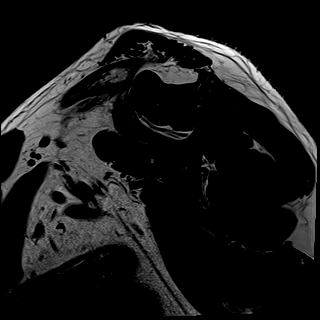
[im 14/28]
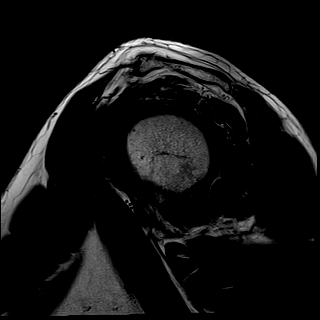
[im 25/28]
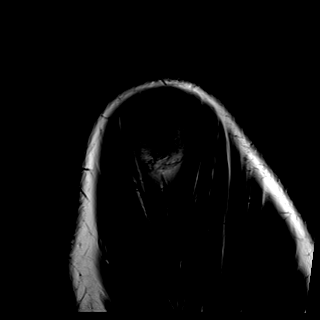

[29 of 40 positions shown; findings below may reference images not displayed]

FINDINGS: OSSEOUS: No acute fracture, avascular necrosis or aggressive osseous lesion. 

ACROMIAL OUTLET: Moderate acromioclavicular joint osteoarthritis. A small  amount of fluid is present within the subacromial/subdeltoid bursa. The acromion is non-hooked. The coracoclavicular and coracoacromial ligaments are intact.

ROTATOR CUFF:

Supraspinatus and infraspinatus: A large full-thickness tearing of the supraspinatus tendon measuring 8 mm AP located 16 mm medial to the tendon footprint, with medial tendon retraction to the humeral apex. Moderate to severe background tendinosis. Moderate to severe infraspinatus tendinosis.

Teres minor: Intact.

Subscapularis: Moderate-size high-grade partial-thickness bursal surface tear of the superior tendon insertion.

Fatty atrophy: Minimal supraspinatus and subscapularis muscle fatty atrophy. The infraspinatus and teres minor muscles are maintained.

LABRUM: Tearing of the posterior superior labrum, with a multiloculated para labral cyst extending towards the spinoglenoid notch measuring 14 x 5 mm. Degenerative tearing of the anterior inferior and inferior labrum with additional paralabral cyst adjacent to the inferior labrum.

BICEPS TENDON: Severe proximal intra-articular long head biceps tendinosis with superimposed longitudinal split tearing within the rotator interval and proximal intertubercular groove. Moderate proximal intra-articular long head biceps tendinosis.

GLENOHUMERAL JOINT: Mild to moderate chondral thinning and irregularity of the inferior glenohumeral articular cartilage, with subchondral cystic change along the anterior inferior glenoid. Irregular high-grade and full-thickness chondrosis of the superior humeral head articular cartilage.

OTHER: The inferior glenohumeral ligament is unremarkable. Small glenohumeral joint effusion.
IMPRESSION: 1.
Large full-thickness tear of the supraspinatus tendon, medial to the footprint. Mild supraspinatus muscle fatty atrophy.

2.
Moderate-size high-grade partial-thickness bursal surface tear. Superior subscapularis tendon with associated medial perching at the long head biceps tendon at the superior intertubercular groove.

3.
Severe proximal intra-articular long head biceps tendinosis with longitudinal split tearing within the rotator interval extending into the intertubercular groove.

4.
Moderate glenohumeral joint chondrosis, with near-circumferential labral degeneration and tearing.

5.
Moderate acromioclavicular joint osteoarthritis.

## 2020-06-19 IMAGING — CR SHOULDER LT 2-3 VWS
1 series · 3 of 3 positions shown · non-contrast
Comparison: Concurrent MRI of the left shoulder.

INDICATION: Bilateral shoulder pain
TECHNIQUE: 3 views of the left shoulder obtained.

[Series 1: internal rotate · 0.17mm/px · 3 of 3 slices shown]
[im 1/3]
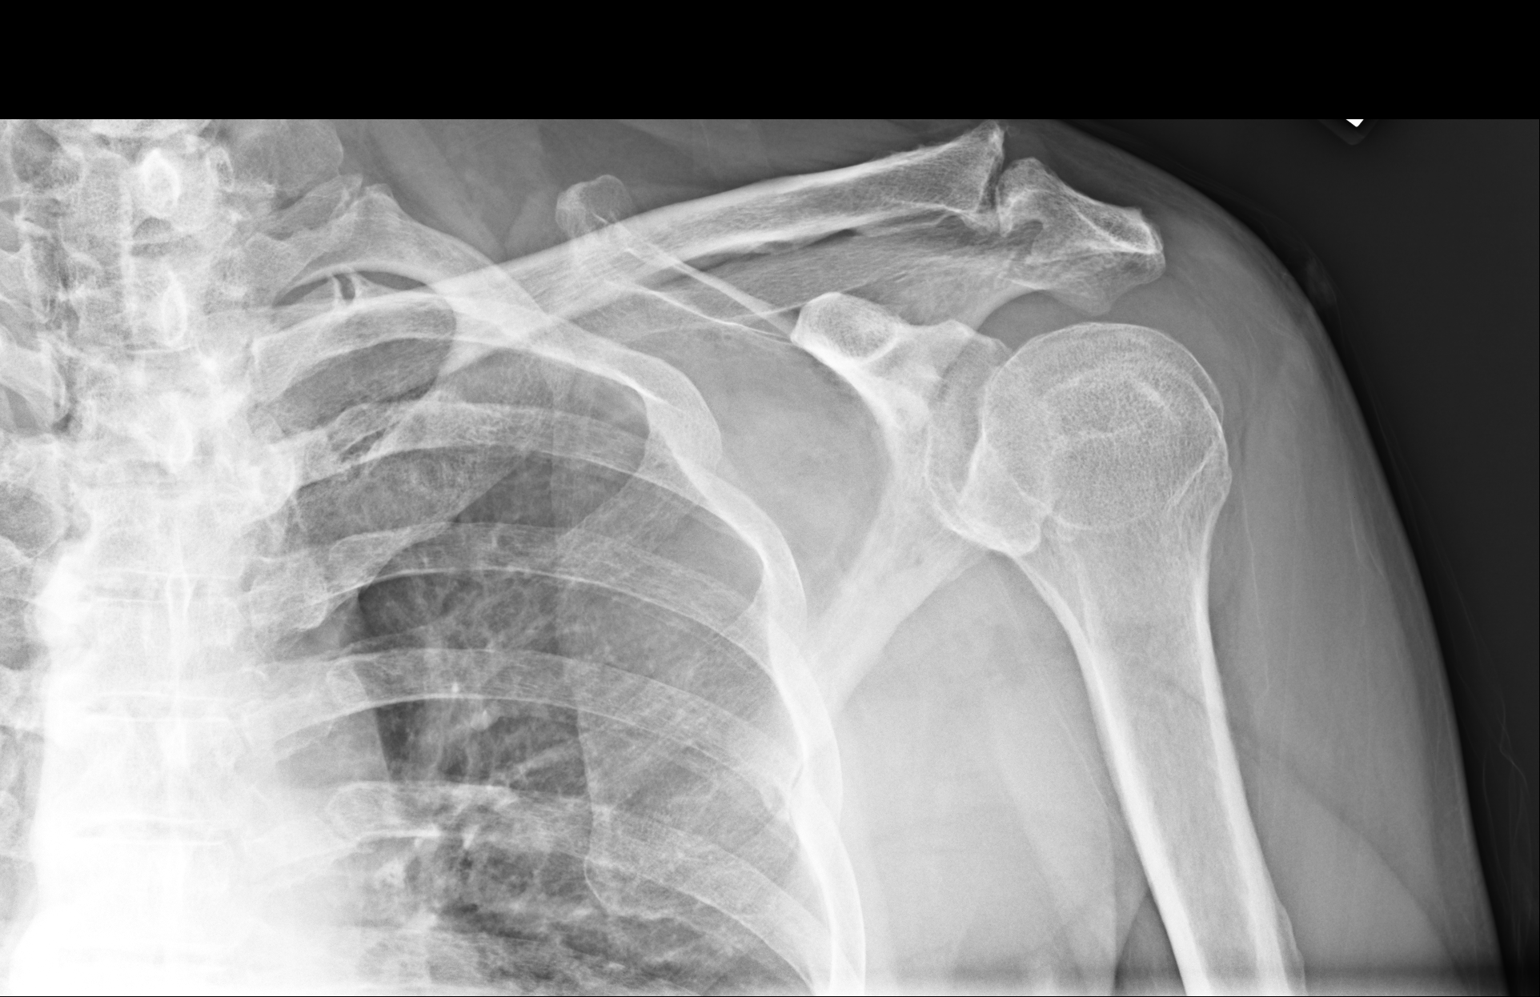
[im 2/3]
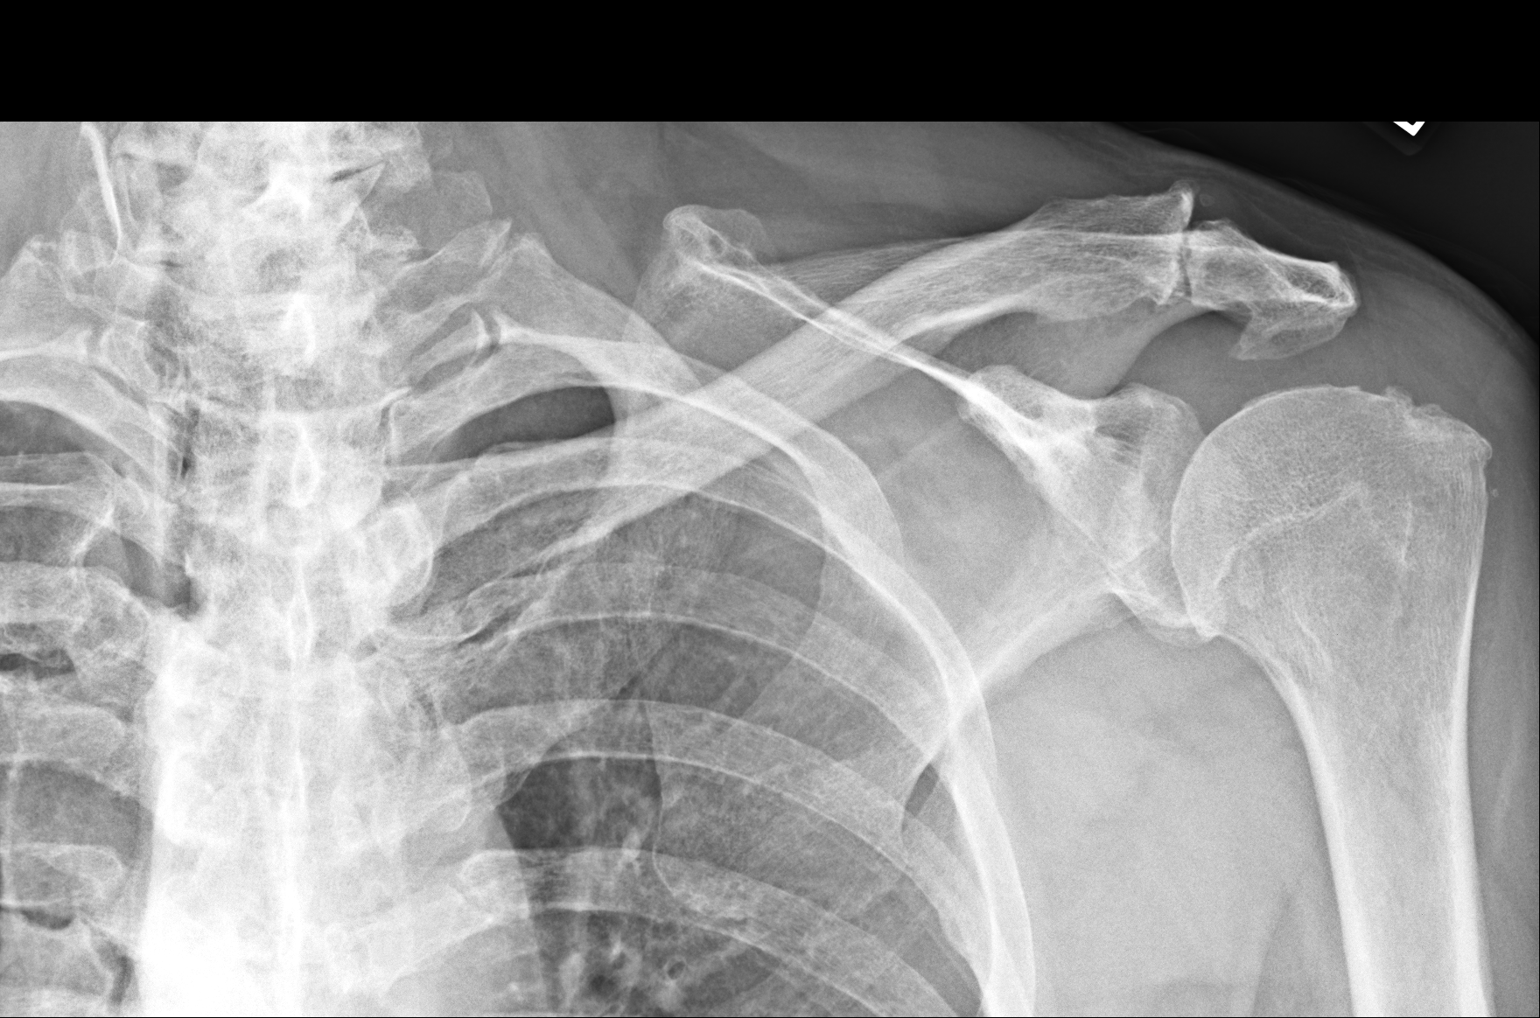
[im 3/3]
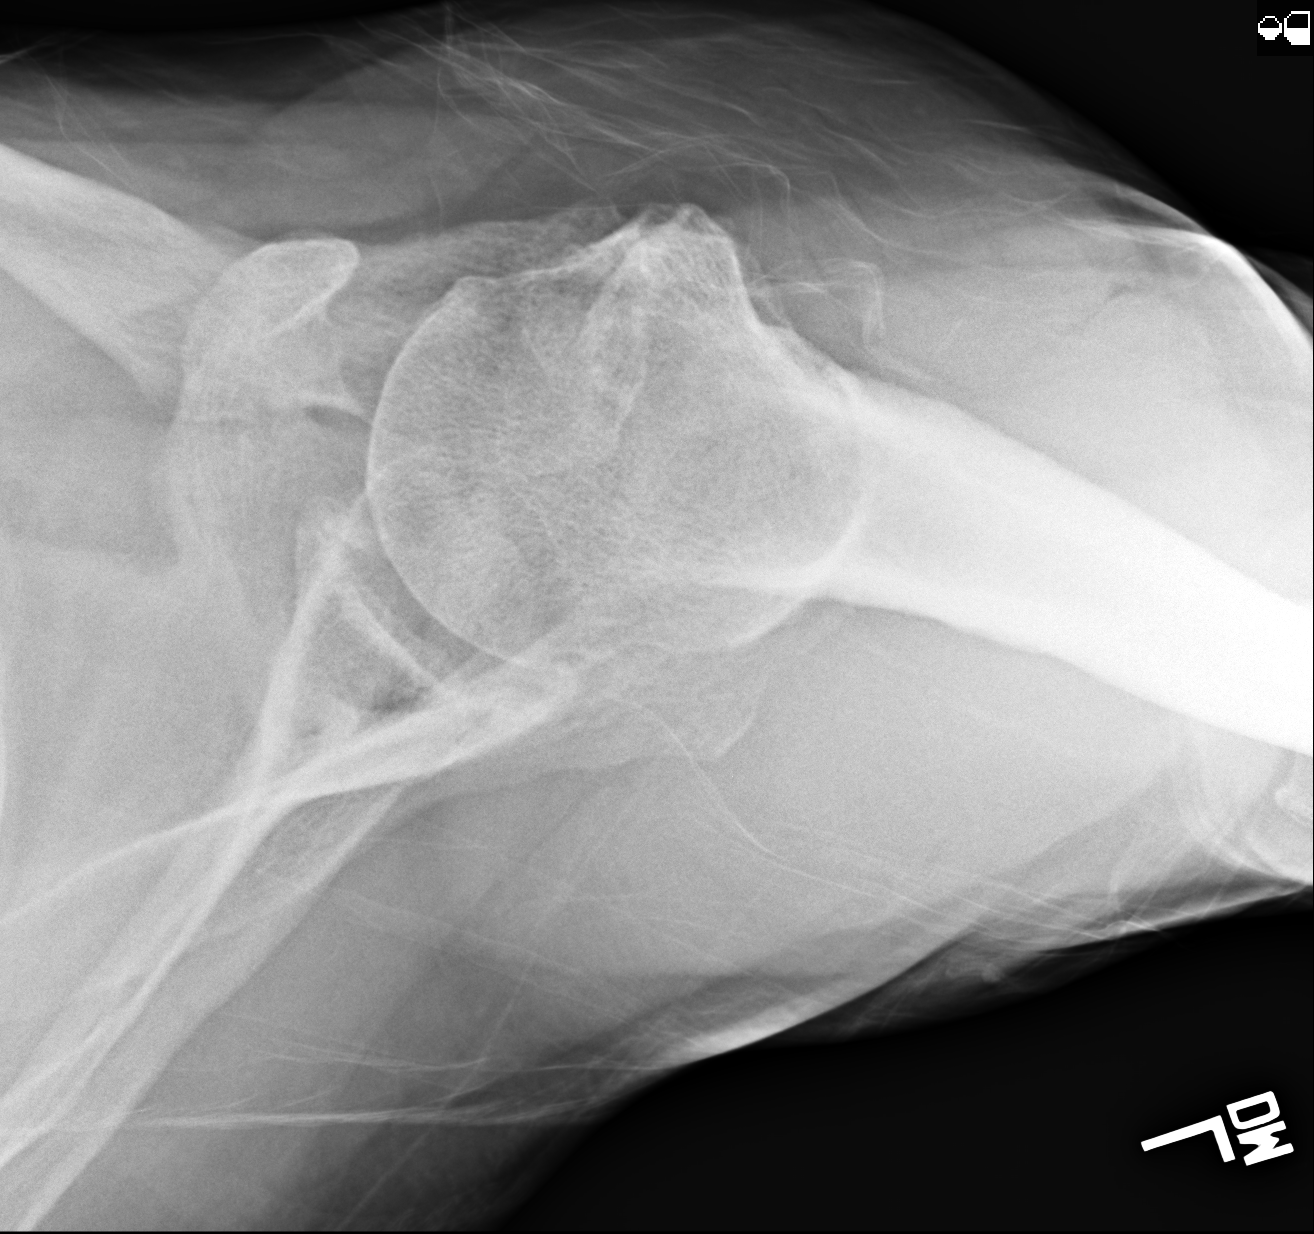

[3 of 3 positions shown; findings below may reference images not displayed]

FINDINGS: No acute fracture or dislocation. Moderate acromioclavicular joint osteoarthritis with moderate subacromial enthesophyte. Moderate glenohumeral joint arthrosis.

Reactive osseous changes are noted along the greater tuberosity, which can be seen in setting of rotator cuff pathology. The visualized portion of the left chest is unremarkable.
IMPRESSION: Moderate acromioclavicular and glenohumeral joint osteoarthritis.

## 2020-06-19 IMAGING — CR C-SPINE 4 or 5 views
1 series · 7 of 7 positions shown · non-contrast
Comparison: None.

INDICATION: Cervicalgia.
TECHNIQUE: Cervical spine series, 7 views.

[Series 1: lat · 0.17mm/px · 7 of 7 slices shown]
[im 1/7]
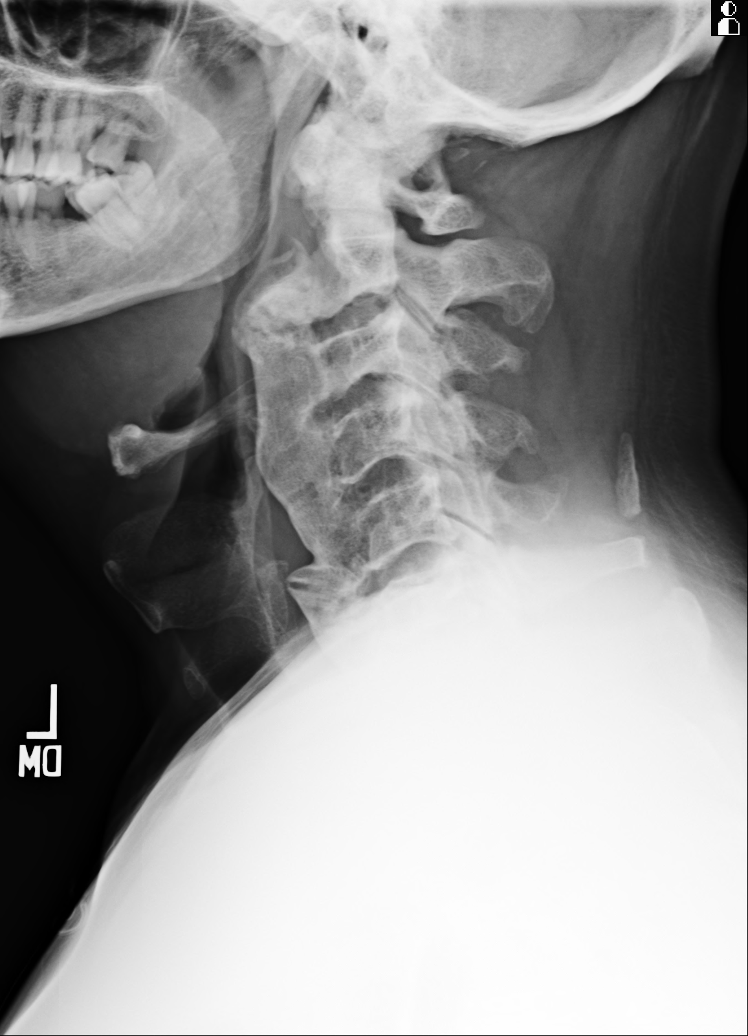
[im 2/7]
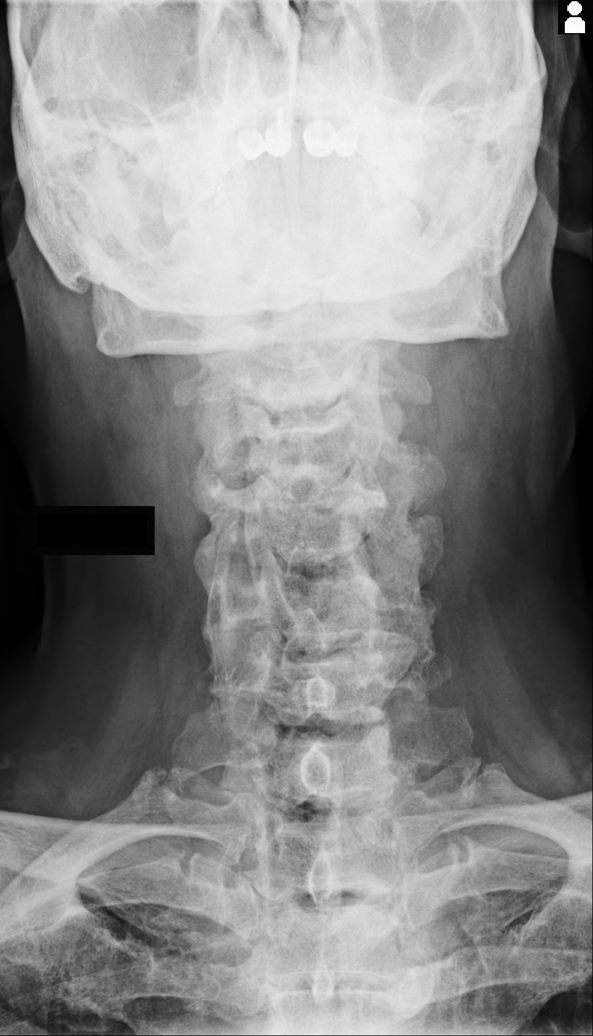
[im 3/7]
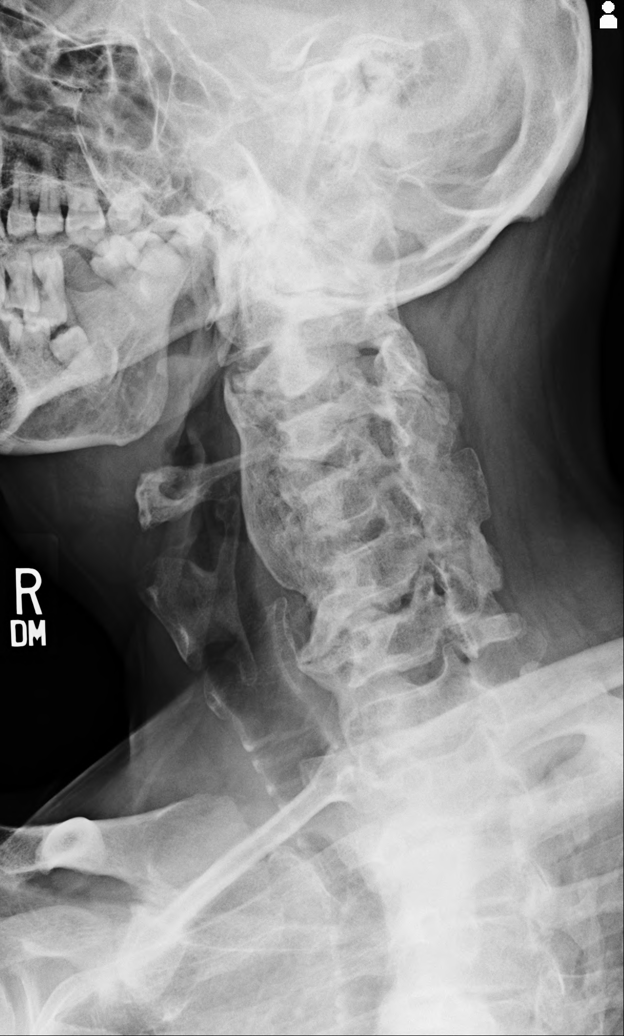
[im 4/7]
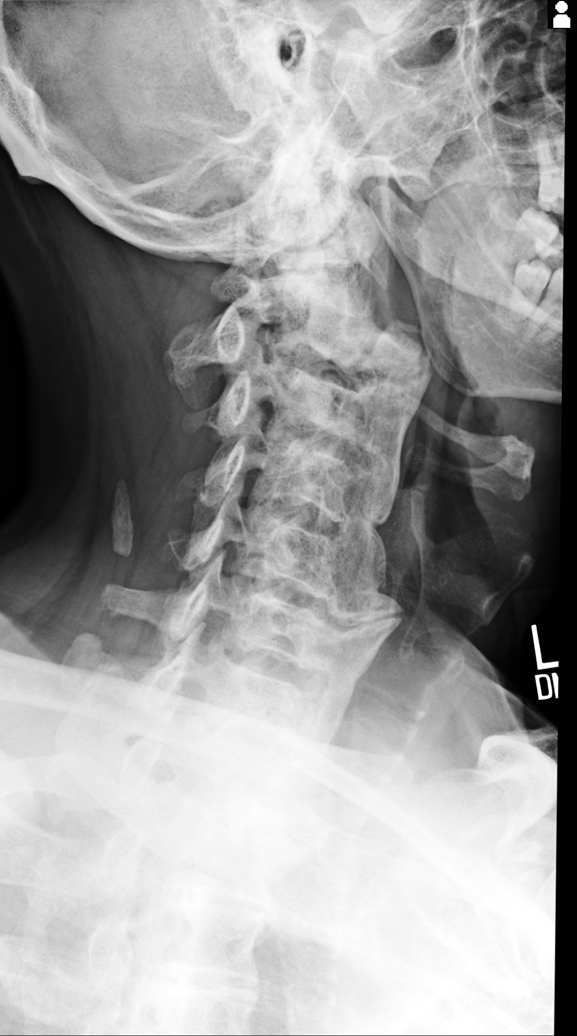
[im 5/7]
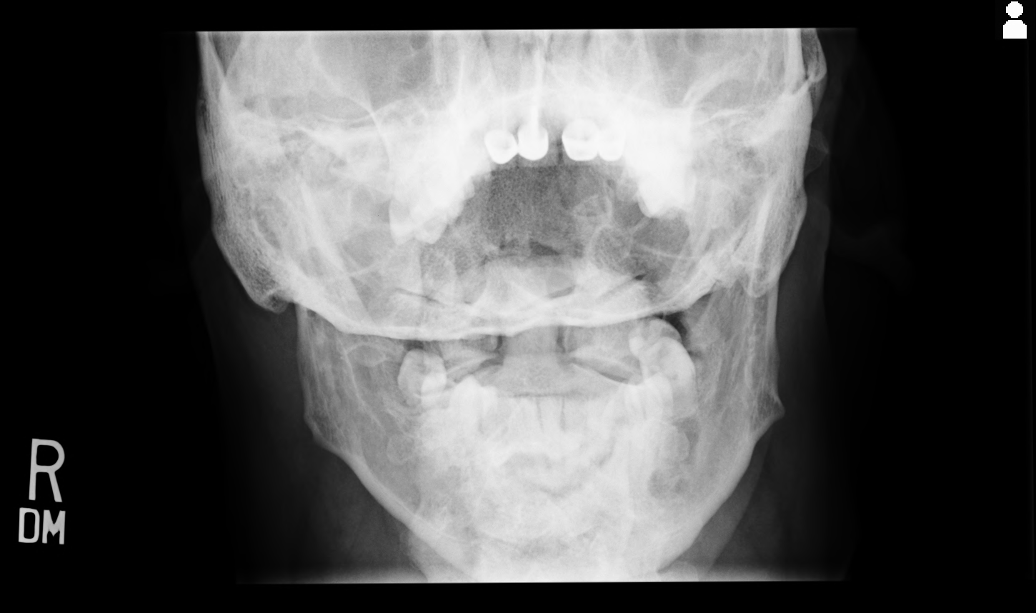
[im 6/7]
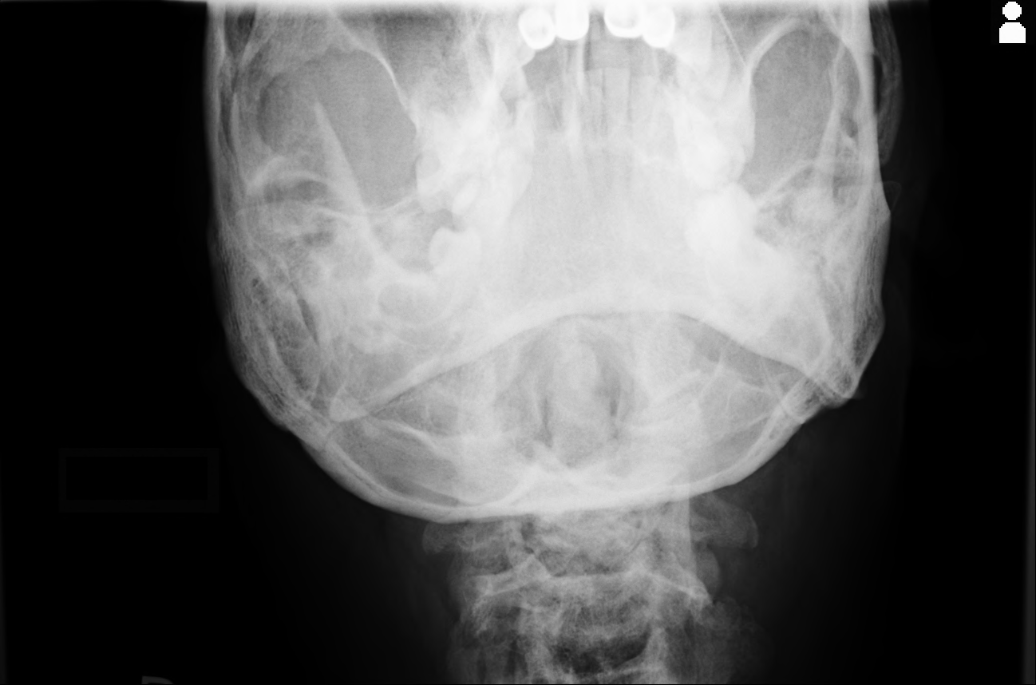
[im 7/7]
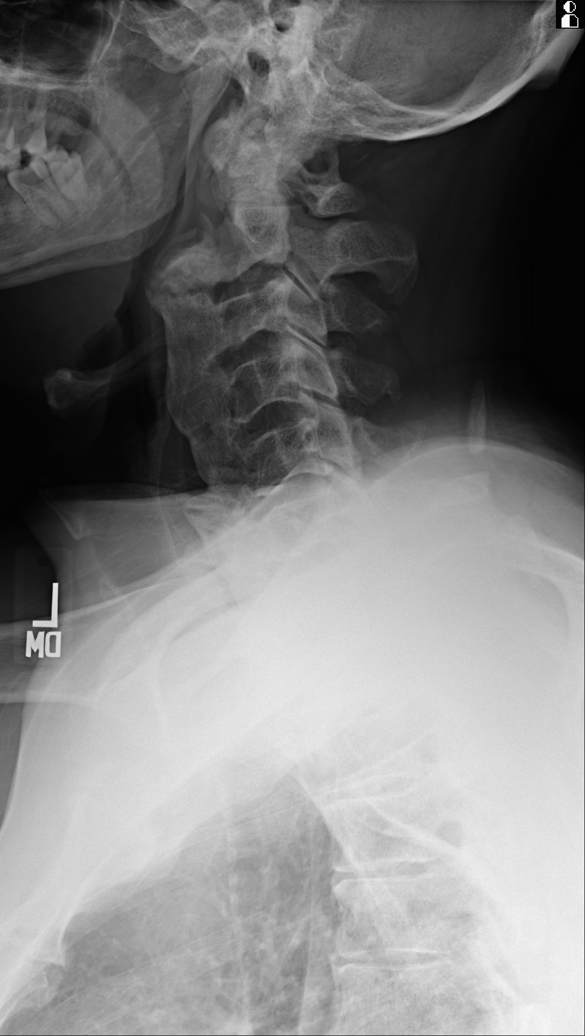

[7 of 7 positions shown; findings below may reference images not displayed]

FINDINGS: There is prominent thick ossification of the anterior longitudinal ligament from C2 down to T1. Bilateral degenerative changes of the facets are seen with fusion of the left facets from C3 down to C5. Bilateral degenerative changes of the uncovertebral joints are seen throughout the cervical spine. Oblique film shows moderate bilateral foraminal stenosis with severe left C5-C6 foraminal stenosis. Focal calcification of the nuchal ligament is seen at C5 level. There is normal relationship between the skull and upper cervical spine with degenerative changes between the anterior C1 neural arch and odontoid process.
IMPRESSION: Thick ossification of the anterior longitudinal ligament from C2 down to T1. Degenerative changes of the facets and uncovertebral joints with fusion of the left facets from C3 down to C5. Bilateral foraminal stenosis as described, more prominent in left C5-C6.

## 2020-06-19 IMAGING — MR MRI SHOULDER RT WO CONTRAST
4 series · 30 of 40 positions shown · non-contrast
Comparison: None available.

INDICATION: Primary osteoarthritis, shoulder pain.
TECHNIQUE: Multiplanar, multisequence MR imaging of the right shoulder without contrast.

[Series 5: t2_axial_fs · axial · right · 3.0mm · 0.47mm/px · z∈[-34,+54]mm · 9 of 23 slices shown]
[im 1/23]
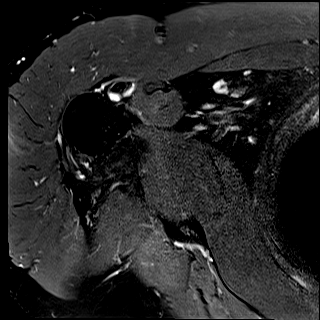
[im 3/23]
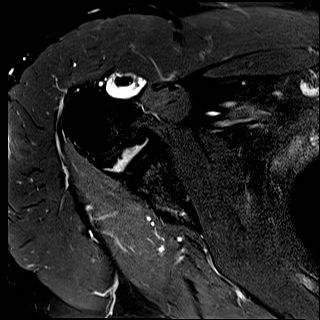
[im 6/23]
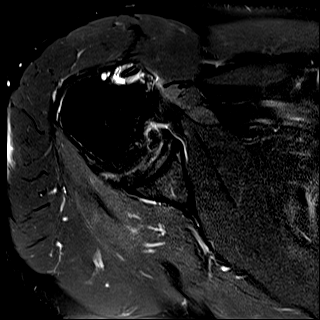
[im 9/23]
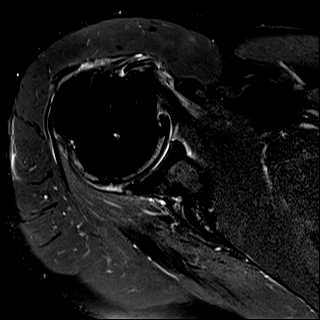
[im 12/23]
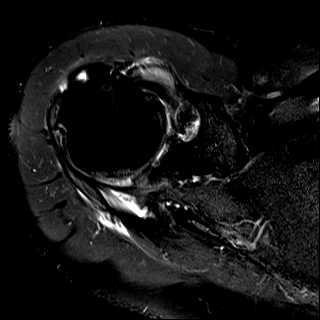
[im 14/23]
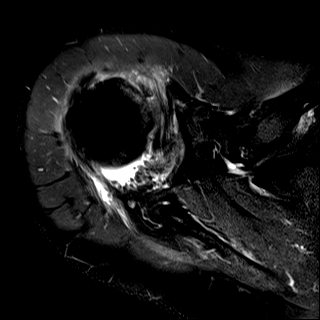
[im 17/23]
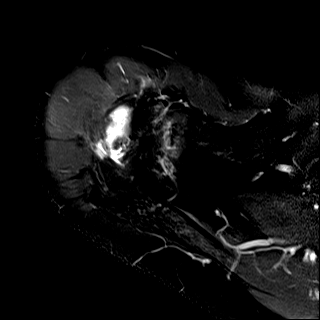
[im 20/23]
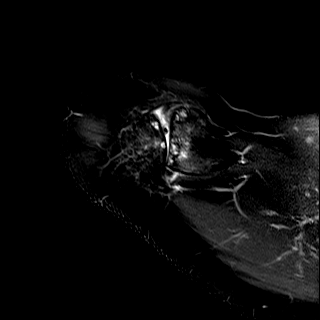
[im 23/23]
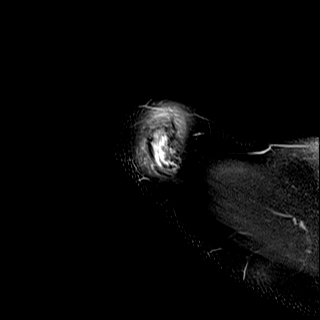

[Series 6: t2_cor_fs · oblique · right · 3.0mm · 0.48mm/px · 9 of 25 slices shown]
[im 1/25]
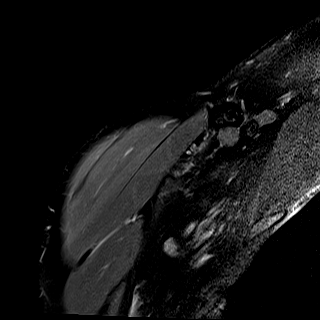
[im 4/25]
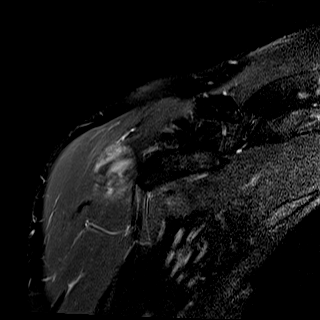
[im 7/25]
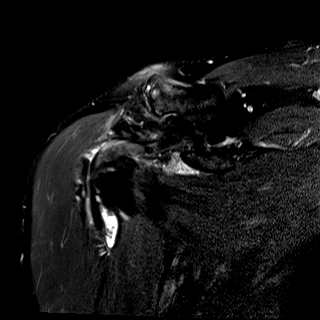
[im 10/25]
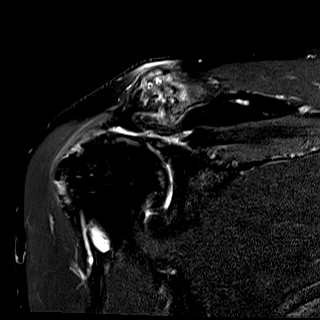
[im 13/25]
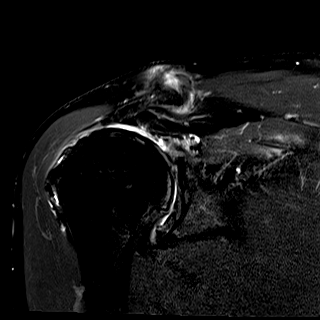
[im 16/25]
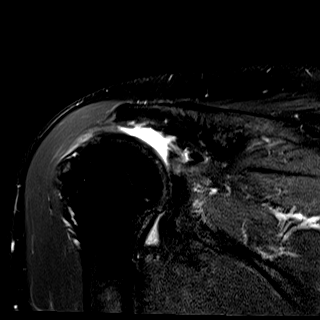
[im 19/25]
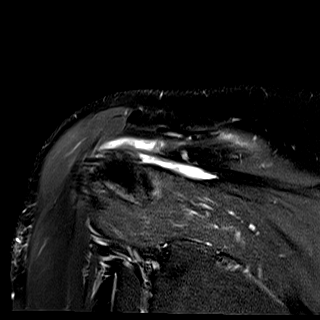
[im 22/25]
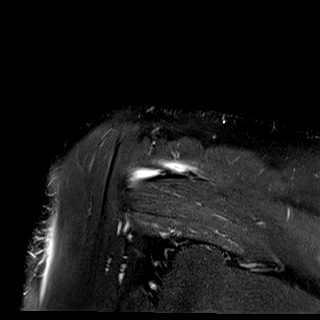
[im 25/25]
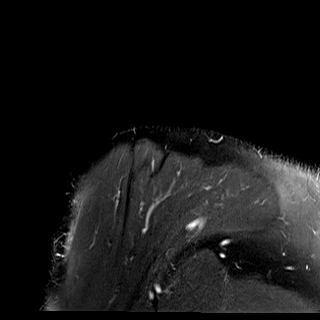

[Series 7: t2_sag_fs · oblique · right · 3.0mm · 0.29mm/px · 9 of 28 slices shown]
[im 1/28]
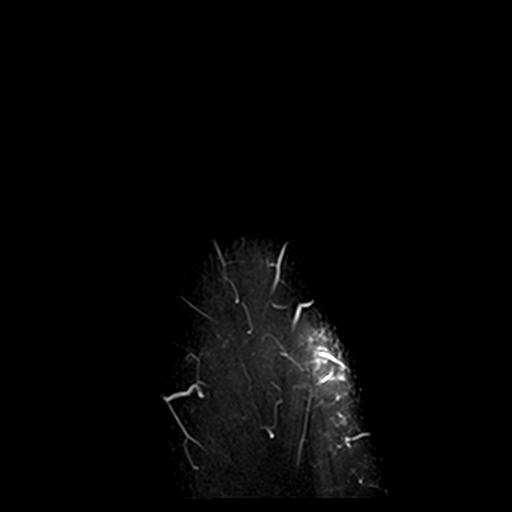
[im 3/28]
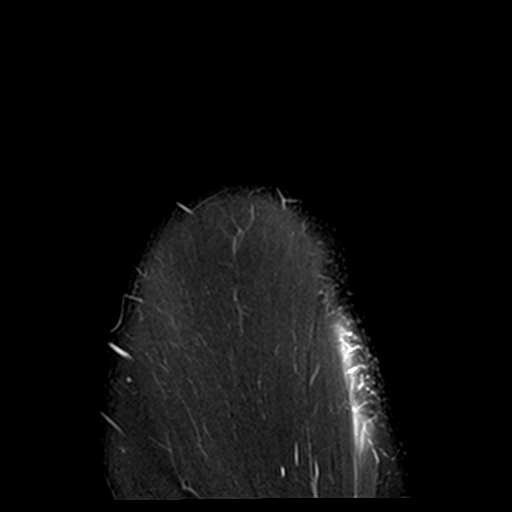
[im 6/28]
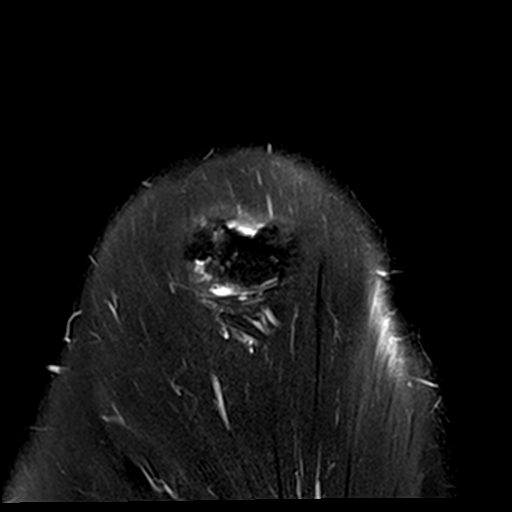
[im 9/28]
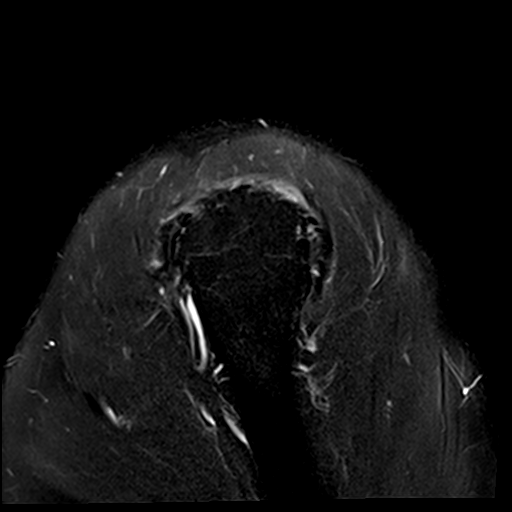
[im 11/28]
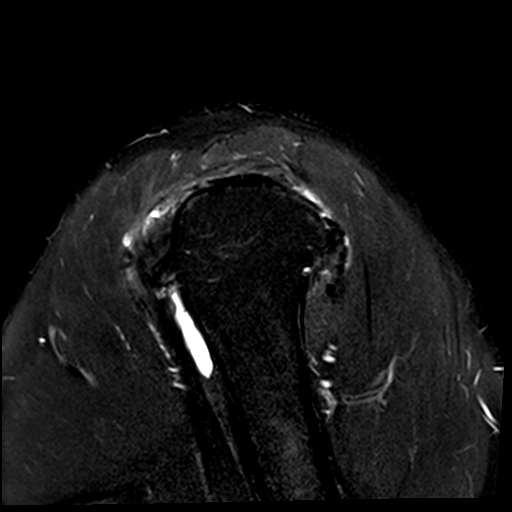
[im 14/28]
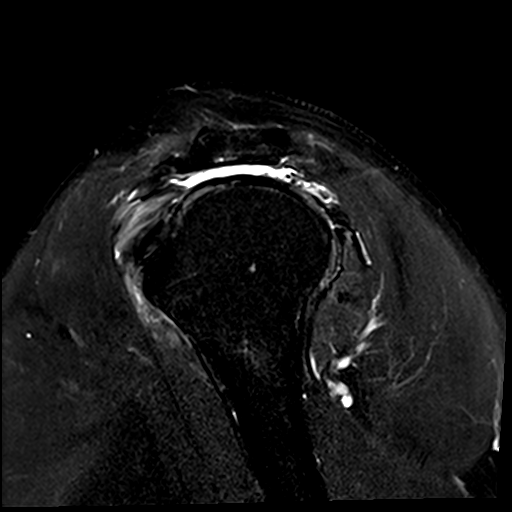
[im 17/28]
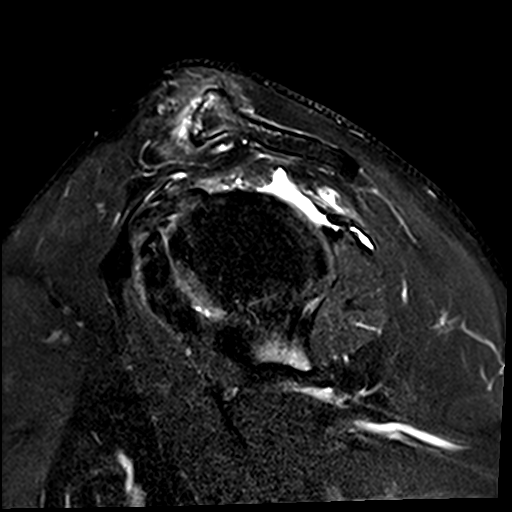
[im 19/28]
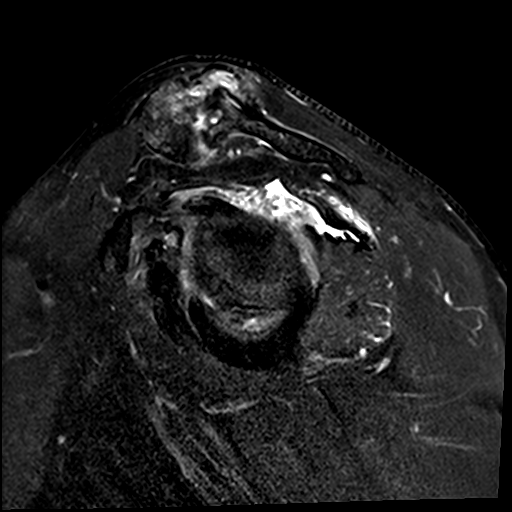
[im 25/28]
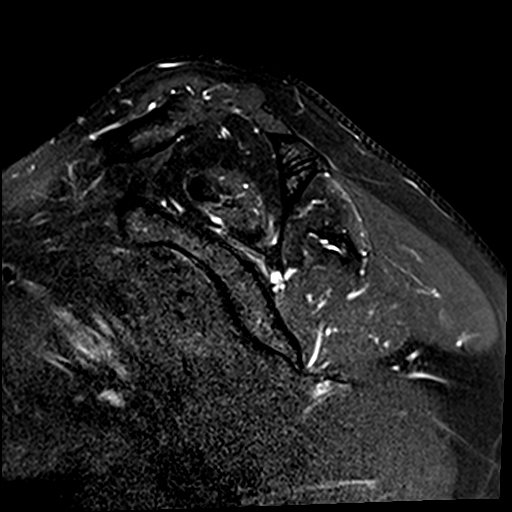

[Series 8: t1_sag · oblique · right · 3.0mm · 0.47mm/px · 3 of 30 slices shown]
[im 3/30]
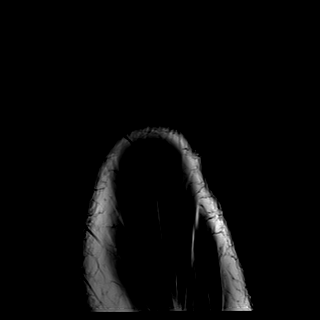
[im 15/30]
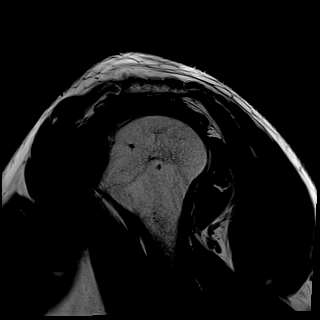
[im 27/30]
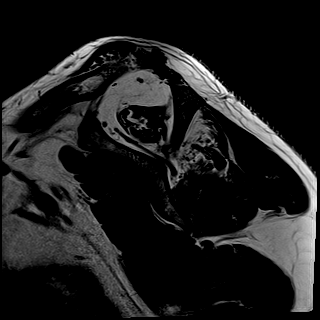

[30 of 40 positions shown; findings below may reference images not displayed]

FINDINGS: OSSEOUS: No acute fracture, avascular necrosis or aggressive osseous lesion. 

ACROMIAL OUTLET: Moderate to severe acromioclavicular joint osteoarthritis. A small  amount of fluid is present within the subacromial/subdeltoid bursa which decompresses through full-thickness rotator cuff tear. The acromion is nonhooked. The coracoclavicular and coracoacromial ligaments are intact.

ROTATOR CUFF:

Supraspinatus and infraspinatus: Full-thickness, full-width tear of the supraspinatus tendon with medial tendon retraction, medial to the glenoid. Full-thickness tearing extends into the anterior one-half of the infraspinatus tendon, moderate intrasubstance delamination of fluid to the infraspinatus myotendinous junction. Moderate tendinosis of the posterior infraspinatus tendon fibers.

Teres minor: Intact.

Subscapularis: Moderate tendinosis.

Fatty atrophy: Mild to moderate supraspinatus and moderate infraspinatus muscle fatty atrophy. The teres minor and subscapularis tendons are intact.

LABRUM: Degenerative tearing of the superior and posterior superior labrum. Tearing of the anterior inferior and inferior labrum.

BICEPS TENDON: Severe proximal intra-articular long head biceps tendinosis with medial perching at the level of the superior intertubercular groove. Superimposed longitudinal split tear within the rotator interval extending into the superior intertubercular groove. Moderate extra-articular long head biceps tendinosis.

GLENOHUMERAL JOINT: Large region of high-grade and full-thickness chondrosis of the superior humeral head. Moderate thinning and chondral irregularity of the inferior glenohumeral articular cartilage. Multiple foci of reactive subchondral marrow edema present.

OTHER: The inferior glenohumeral ligament is unremarkable. Small glenohumeral joint effusion, with ill-defined signal within the axillary recess, most compatible with synovitis.
IMPRESSION: 1.
Large full-thickness, full-width retracted tears of the supraspinatus and infraspinatus tendons. Mild to moderate supraspinatus and moderate infraspinatus muscle fatty atrophy.

2.
Moderate glenohumeral joint chondrosis, with near circumferential labral degeneration and tearing.

3.
Severe proximal intra-articular long head biceps tendinosis with superimposed longitudinal split tear extending from the rotator interval to the intertubercular groove.

4.
Moderate to severe acromioclavicular joint osteoarthritis.

## 2020-06-20 IMAGING — MR MRI CSPINE WO CONTRAST
5 series · 33 of 48 positions shown · non-contrast
Comparison: Correlation is made to previous CT of the neck June 18, 2020 and cervical spine x-rays June 19, 2020.

INDICATION: Neck pain with cervical radiculopathy radiating down to both upper extremities.
TECHNIQUE: MRI of the cervical spine without contrast. Sagittal multisequence and axial T2-weighted images were performed through the cervical spine without intravenous contrast with additional coronal T1-weighted images.

[Series 5: t2_sag · sagittal · 3.0mm · 0.76mm/px · 8 of 17 slices shown]
[im 1/17]
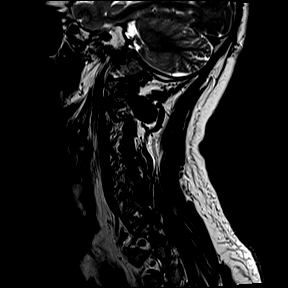
[im 3/17]
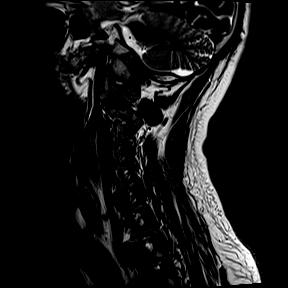
[im 5/17]
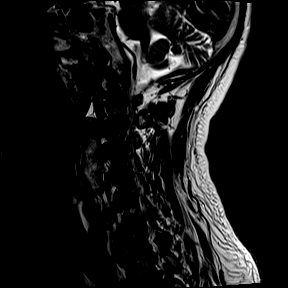
[im 7/17]
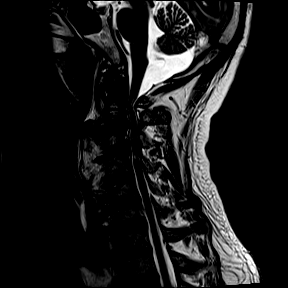
[im 10/17]
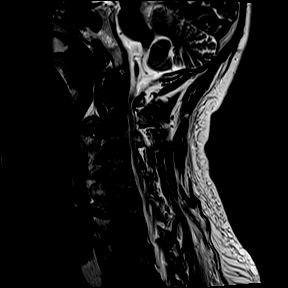
[im 12/17]
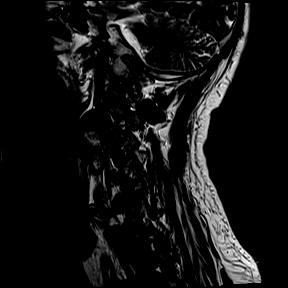
[im 14/17]
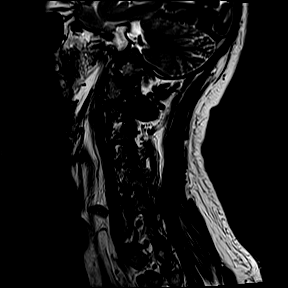
[im 17/17]
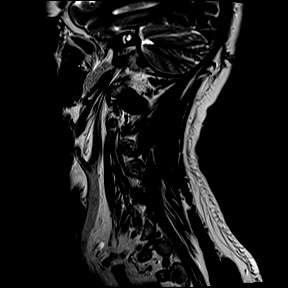

[Series 6: t1_sag · sagittal · 3.0mm · 0.69mm/px · 7 of 17 slices shown]
[im 1/17]
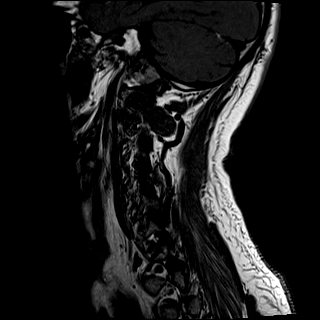
[im 3/17]
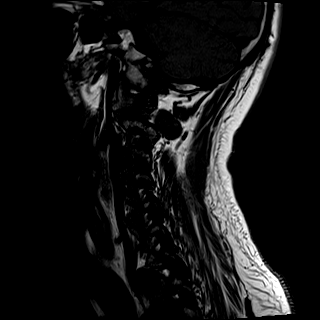
[im 6/17]
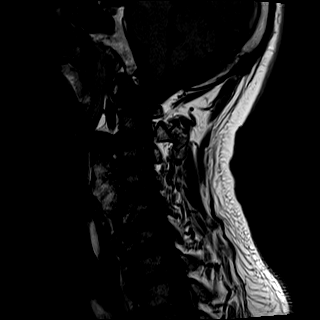
[im 9/17]
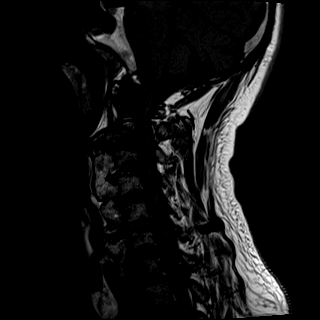
[im 11/17]
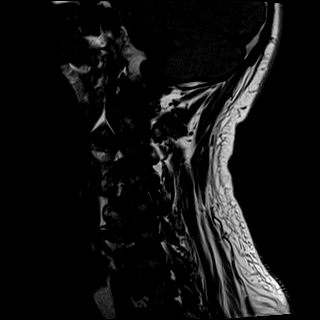
[im 14/17]
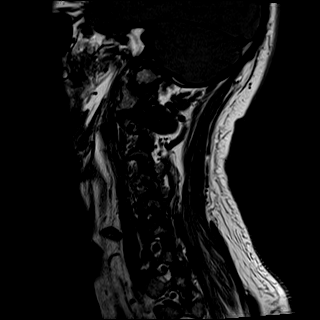
[im 17/17]
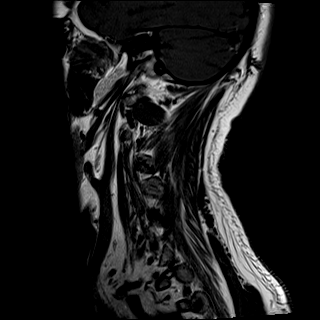

[Series 7: ir_sag · sagittal · 3.0mm · 0.86mm/px · 7 of 17 slices shown]
[im 1/17]
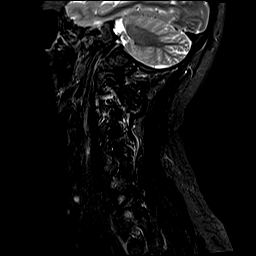
[im 3/17]
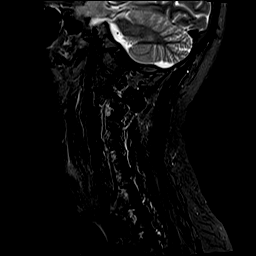
[im 6/17]
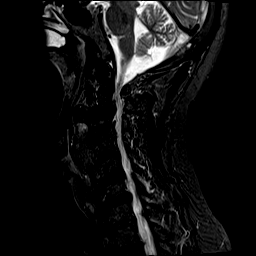
[im 9/17]
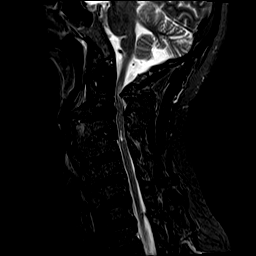
[im 11/17]
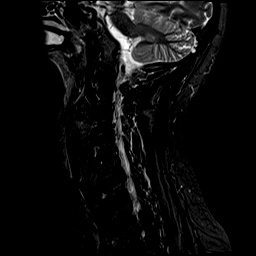
[im 14/17]
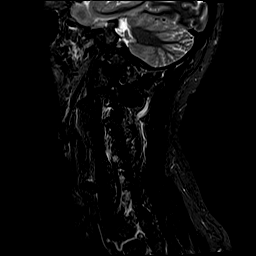
[im 17/17]
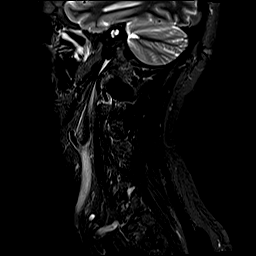

[Series 8: t2_medic_axial · axial · 3.0mm · 0.35mm/px · z∈[-150,-31]mm · 9 of 32 slices shown]
[im 1/32]
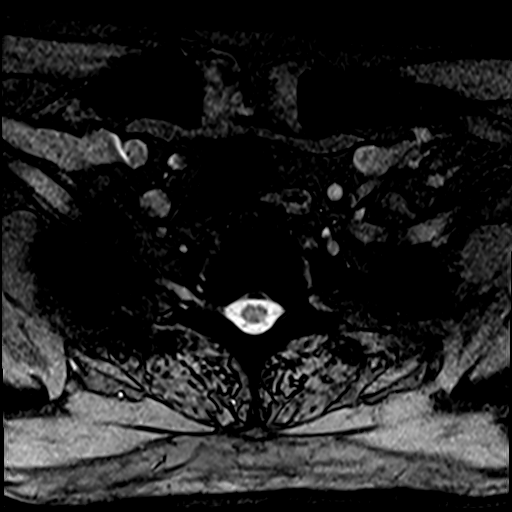
[im 6/32]
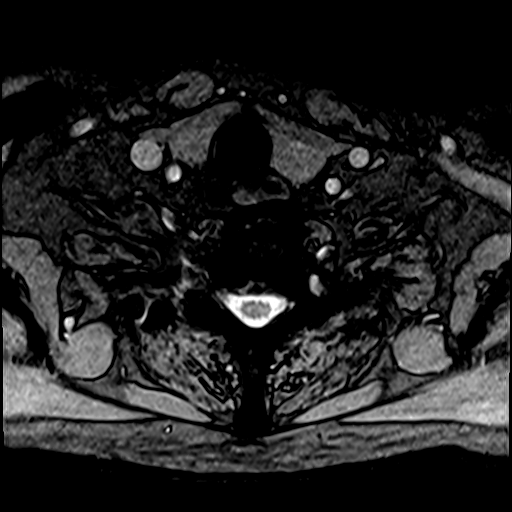
[im 11/32]
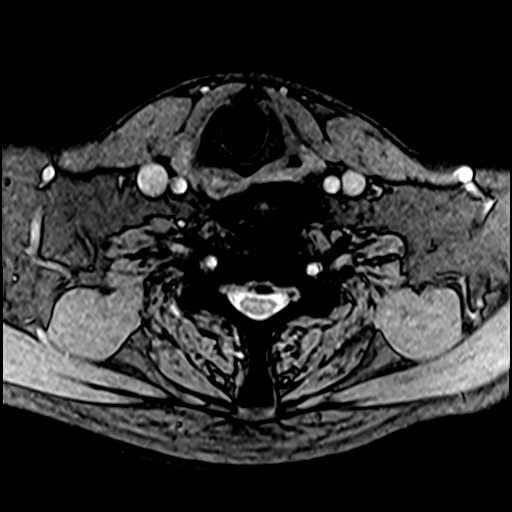
[im 13/32]
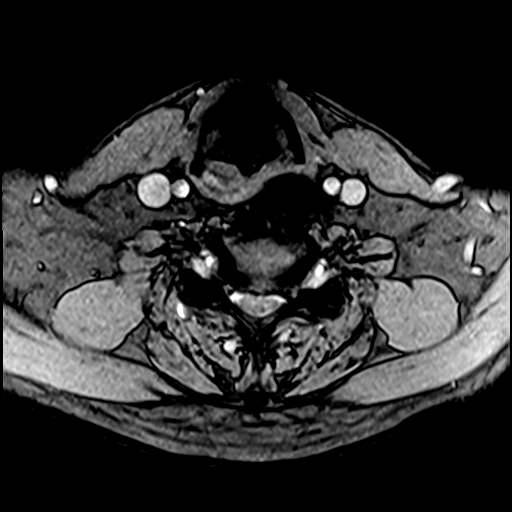
[im 16/32]
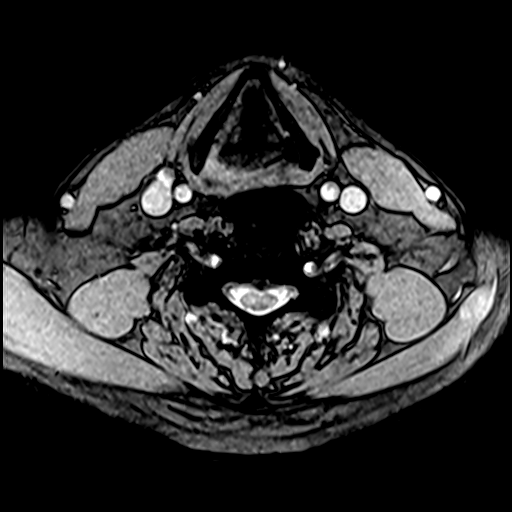
[im 19/32]
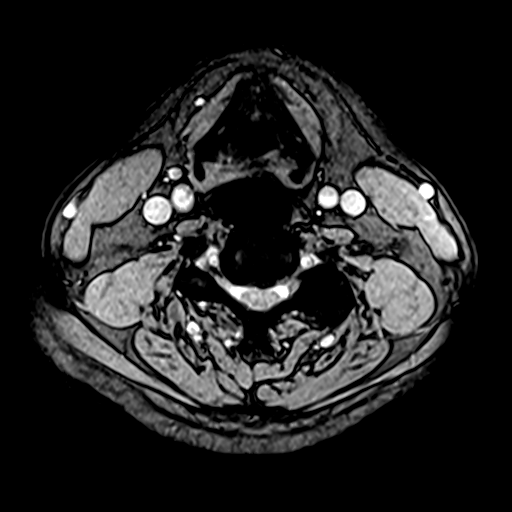
[im 21/32]
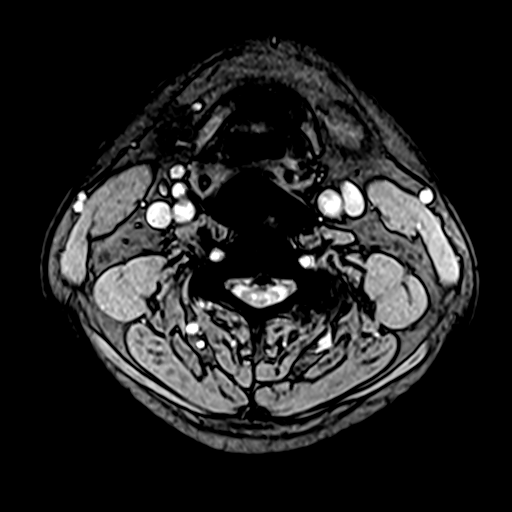
[im 26/32]
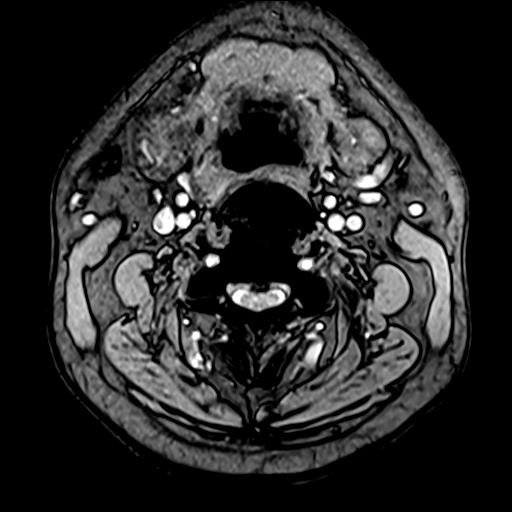
[im 32/32]
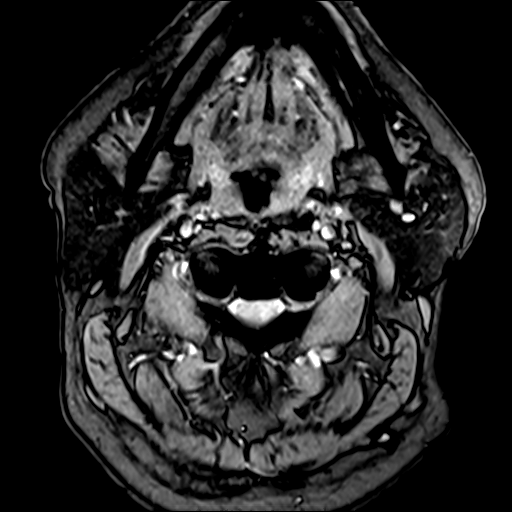

[Series 9: t2_axial · axial · 3.0mm · 0.70mm/px · z∈[-150,-131]mm · 2 of 32 slices shown]
[im 1/32]
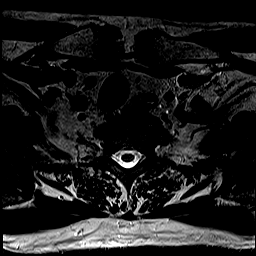
[im 6/32]
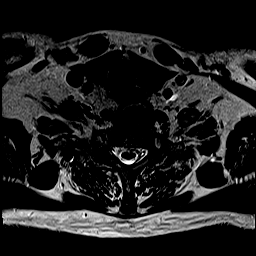

[33 of 48 positions shown; findings below may reference images not displayed]

FINDINGS: There is straightening of the normal cervical lordosis, which may be positional or due to muscle spasm. There is normal relationship between the skull and upper cervical spine. There is thick ossification of the anterior longitudinal ligament from the inferior C2 vertebral body down to T1 superior endplate level measuring approximately 15.78 mm anteroposteriorly. Associated anterior osteophytes are seen throughout the cervical and visualized upper thoracic spine. There is hypoplasia of the posterior C1 neural arch with associated moderate to severe segmental spinal canal stenosis with an anteroposterior dimension of the cervical spine measuring 4.65 mm. There is associated mild increased signal intensity of the cervical neural cord. Small focal area of increased signal intensity of the cervical neural cord at the superior endplate of C4 vertebral body, most likely due to myelomalacia.

At C2-C3, there is a broad disc bulge with small overlying osteophyte and bilateral degenerative changes of the uncovertebral joints. This produces moderate to severe segmental spinal canal stenosis with an anteroposterior dimension of the cervical spine measuring 6.16 mm with severe right foraminal stenosis and no significant left foraminal stenosis. No abnormal signal intensity of the cervical neural cord is seen.

At C3-C4, there is a broad disc bulge with small overlying osteophyte and mild degenerative changes of the left facet. This produces moderate segmental spinal canal stenosis with an anteroposterior dimension of the cervical spine measuring approximately 6.3 mm.

At C4-C5, there are mild degenerative changes of the left facet producing no significant spinal canal or foraminal stenosis.

At C5-C6, there is a disc bulge with minimal degenerative changes of the uncovertebral joints producing mild segmental spinal canal stenosis with moderate bilateral foraminal stenosis.

At C6-C7, there is a right paracentral disc protrusion with degenerative changes of the right uncovertebral joint. This produces moderate to severe right foraminal stenosis with mild to moderate segmental spinal canal stenosis and no left foraminal stenosis.

At C7-T1, there is no disc herniation, spinal canal or foraminal stenosis.
IMPRESSION: 1. Anterior longitudinal ligament is seen throughout the cervical spine.

2. Degenerative disc disease with degenerative changes of the facets and uncovertebral joints producing spinal canal and foraminal stenosis as described.

3. Severe segmental spinal canal stenosis at the C1 vertebral level mainly by hypoplasia of the posterior C1 neural arch with spinal canal measuring approximately 4.65 mm anteroposteriorly with increased signal intensity of the cervical neural cord.

4. Small focal area of increased signal intensity of the neural cord at the superior endplate of C4 vertebral level, most likely due to myelomalacia.

STAT Fax

## 2020-07-22 IMAGING — CT CT ABDOMEN WO/W CONTRAST
3 of 6 series · 11 of 46 positions shown, 18 images · non-contrast
Comparison: Abdomen ultrasound study 06/18/2020.

HISTORY: Left kidney mass. Abnormal ultrasound study.
TECHNIQUE: 3 mm axial [address] CT abdomen study performed without and with 100 mL Dsovue-333 IV contrast. Arterial phase and venous phase images obtained. Coronal and sagittal reformation images obtained. 450 mL Gastrografin/water oral contrast given. I-STAT creatinine obtained locally on site equals 0.6 mg/dL.

[Series 4: venous · axial · portal-venous · 0.78mm/px · z∈[-302,-80]mm · 7 of 100 slices shown, 12 images]
[im 13/100  soft-tissue]
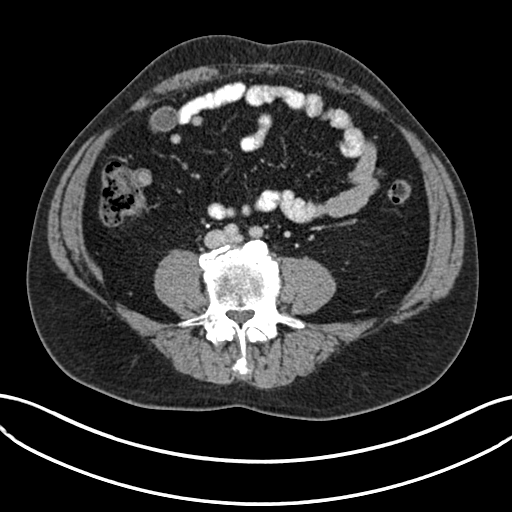
[im 13/100  bone]
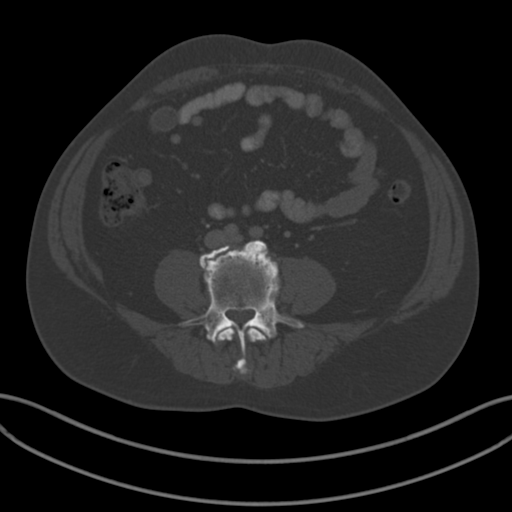
[im 25/100  soft-tissue]
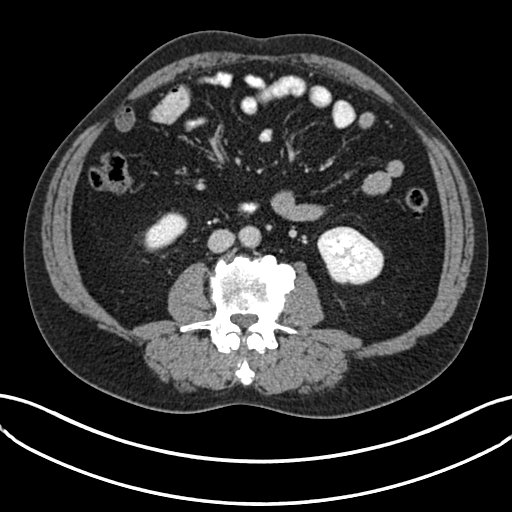
[im 38/100  soft-tissue]
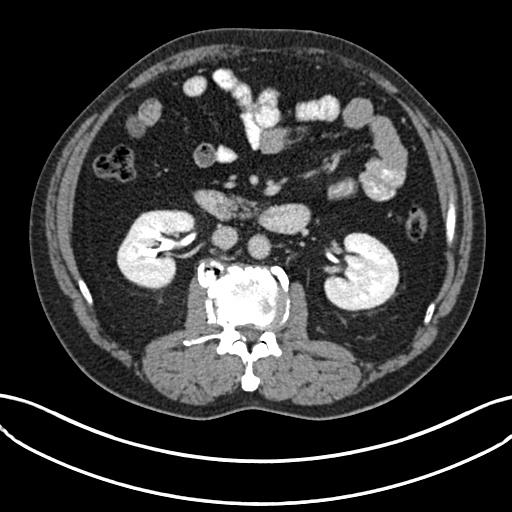
[im 50/100  soft-tissue]
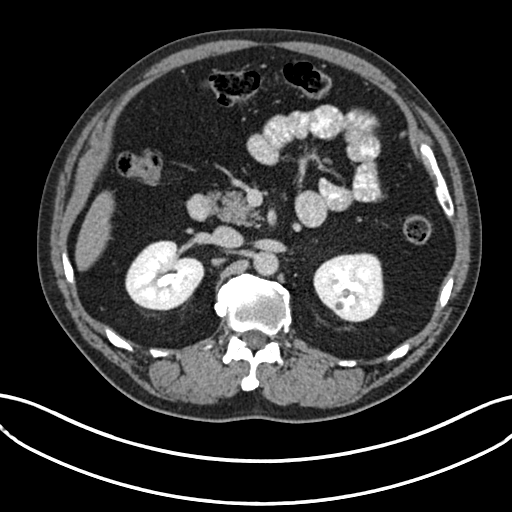
[im 50/100  lung]
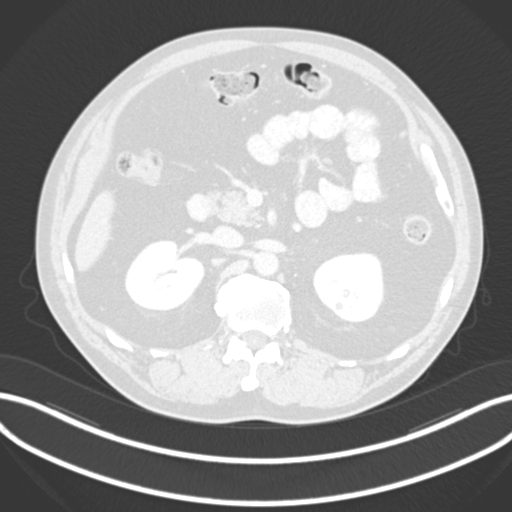
[im 62/100  soft-tissue]
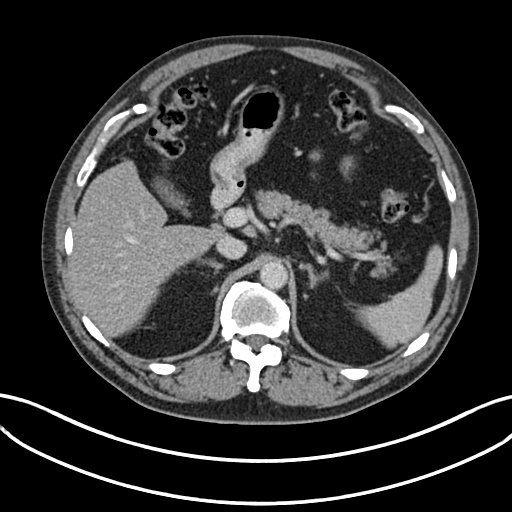
[im 62/100  lung]
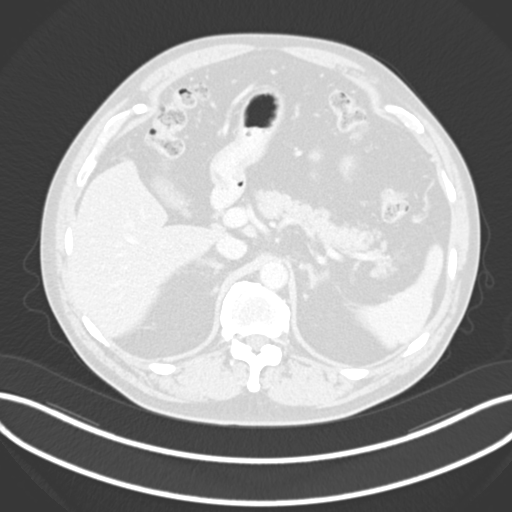
[im 75/100  soft-tissue]
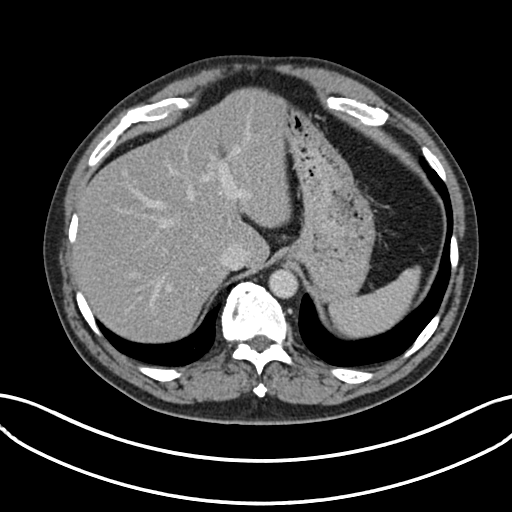
[im 75/100  lung]
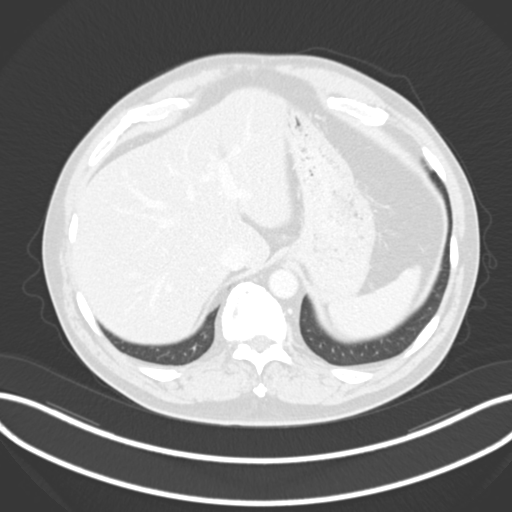
[im 87/100  soft-tissue]
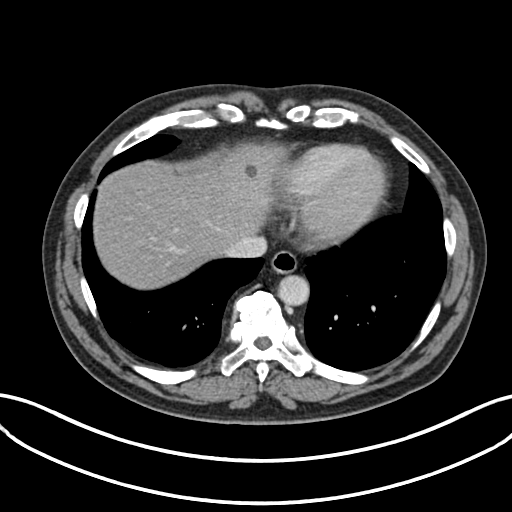
[im 87/100  lung]
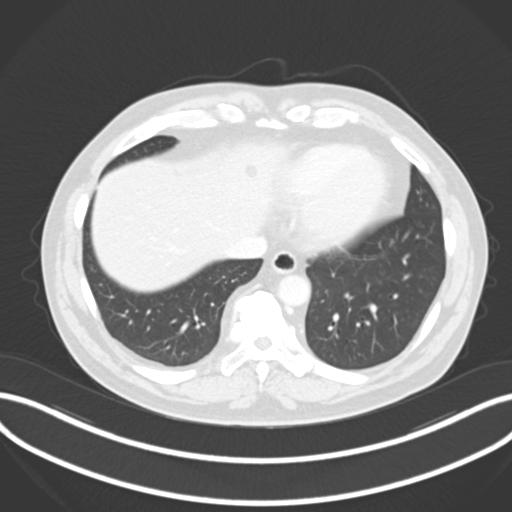

[Series 6: coronal · coronal · 0.62mm/px · 3 of 103 slices shown, 4 images]
[im 35/103  soft-tissue]
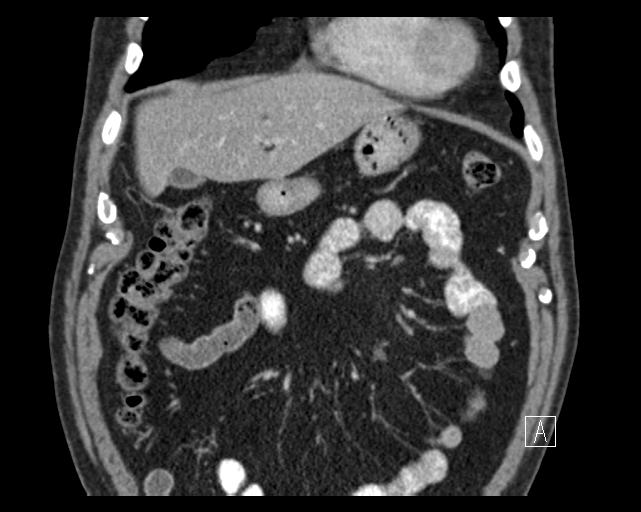
[im 46/103  soft-tissue]
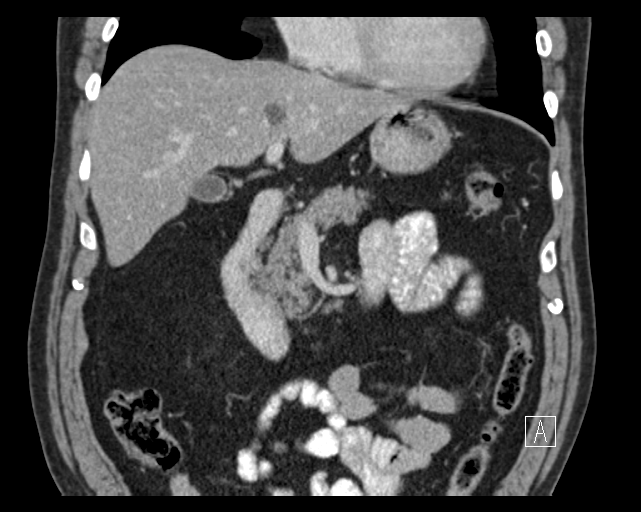
[im 46/103  bone]
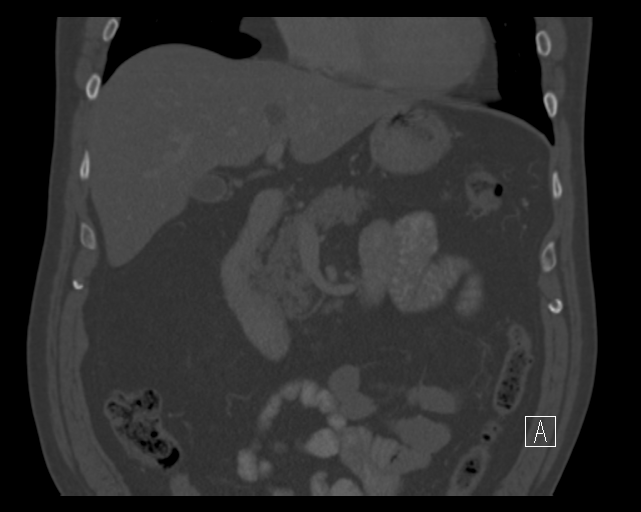
[im 57/103  soft-tissue]
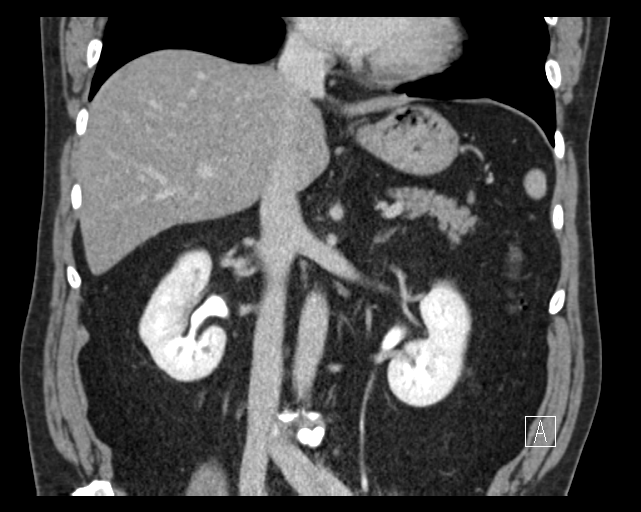

[Series 7: sagittal · sagittal · 0.63mm/px · 1 of 117 slices shown, 2 images]
[im 39/117  soft-tissue]
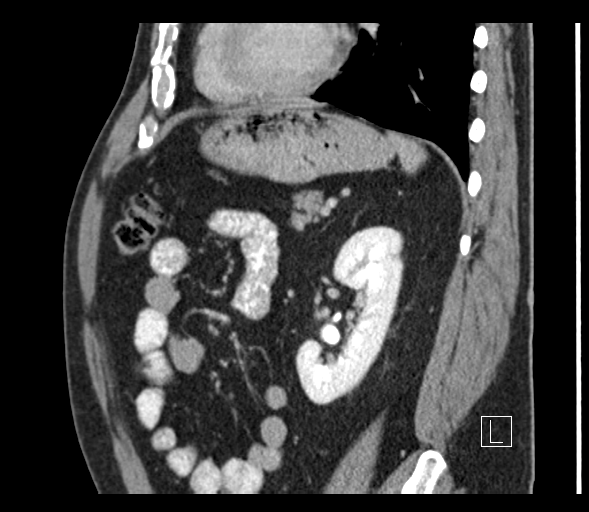
[im 39/117  bone]
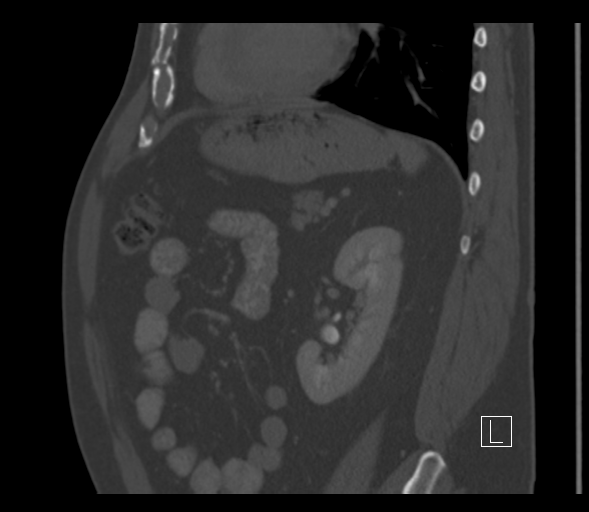

[11 of 46 positions shown; findings below may reference images not displayed]

FINDINGS: Small amount of subsegmental atelectasis involving medial segment of middle lobe seen. Heart size is normal. There is no pericardial effusion. Calcified plaques of abdominal aorta seen without aneurysm. Inferior vena cava is normal.

A few simple renal cysts identified bilaterally. No renal mass lesion is identified. Likely normal variant column of Bertin of left kidney seen on image [DATE].

Moderate fatty infiltration of liver identified. Likely hepatic cysts seen. Gallbladder, pancreas, spleen and adrenal glands are normal. Common bile duct and pancreatic duct are normal.

Distal esophagus, stomach and small bowel loops are normal. Appendix is surgically absent. Scattered diverticulosis of colon identified without adjacent inflammatory change.

No enlarged retroperitoneal lymph nodes seen.

Degenerative changes of spine identified. Mild reverse S-shaped curvature involving thoracolumbar spine seen.

Arterial phase, venous phase, coronal and sagittal reformation images confirm above findings.
IMPRESSION: Normal variant column of Bertin of left kidney again identified. Small simple renal cysts identified bilaterally. No renal mass lesion seen.

Additional chronic findings as noted.

Total radiation dose to patient is CTDIvol 33.94 mGy and DLP 1054.29 mGy-cm.

## 2020-09-03 DIAGNOSIS — Z89612 Acquired absence of left leg above knee: Secondary | ICD-10-CM | POA: Insufficient documentation

## 2020-11-05 ENCOUNTER — Telehealth (INDEPENDENT_AMBULATORY_CARE_PROVIDER_SITE_OTHER): Payer: BC Managed Care – PPO | Admitting: Student in an Organized Health Care Education/Training Program

## 2020-11-05 ENCOUNTER — Encounter (INDEPENDENT_AMBULATORY_CARE_PROVIDER_SITE_OTHER): Payer: Self-pay | Admitting: Student in an Organized Health Care Education/Training Program

## 2020-11-05 VITALS — BP 120/92 | Ht 66.0 in | Wt 160.0 lb

## 2020-11-05 DIAGNOSIS — Z89612 Acquired absence of left leg above knee: Secondary | ICD-10-CM

## 2020-11-05 DIAGNOSIS — Z7409 Other reduced mobility: Secondary | ICD-10-CM

## 2020-11-05 DIAGNOSIS — R2689 Other abnormalities of gait and mobility: Secondary | ICD-10-CM

## 2020-11-05 NOTE — Progress Notes (Signed)
This visit was conducted with the use of interactive audio / video telecommunication that permitted real time communication between patient and myself.      This encounter was performed as a telemedicine visit via secure two-way video and audio to minimize risk and transmission of COVID 19. Patient voiced understanding and verbal consent was obtained for today's telemedicine appointment.      Telemedicine Documentation Requirements     Originating site (Patient location): [place]  Distant site (Provider location): [place], [place]  Provider and Title: Levin Erp, M.D   Consent obtained: YES  Language, if applicable and if translator was required: [place]               New Patient Evaluation      Patient Name:                                                 Jerry Abbott, Jerry Abbott  [birth year]  MRN  [id number]    Referring Provider: [facility]      History of Present Illness:    Jerry Abbott  is a pleasant 60 y.o., male with a history of diabetes who presents for prosthetic evaluation.  Patient had diabetic wound complications of his left lower extremity in Jan/Feb of 2022 and subsequently underwent multiple surgical amputations of the toes, foot and then below the knee.  He then participated in rehabilitation and had to return for an above knee amputation.  He has completed rehab including Home health PT.  He is able to get around with use of a walker and is using a manual chair for longer distances.  He denies recent falls, ongoing wounds.  His Right lower extremity is healthy.  Patient is working with Johnnye Sima at  [facility] and has been using a Hospital doctor for the last 7 days with plans for another 7 days with tighter shrinker starting today.  This would be the patient's first prosthesis.  Prior to his wound complications, patient was independent with all mobility and very active.  He is motivated and looking forward to improving his independence with mobility.          Current  Medications:    Current Outpatient Medications:   .  acetaminophen (TYLENOL) 325 MG tablet, Take 650 mg by mouth every 4 (four) hours as needed, Disp: , Rfl:   .  amLODIPine (NORVASC) 10 MG tablet, Take 10 mg by mouth daily, Disp: , Rfl:   .  clotrimazole (LOTRIMIN) 1 % cream, APPLY CREAM TOPICALLY TO AFFECTED AREA TWICE DAILY, Disp: , Rfl:   .  gabapentin (NEURONTIN) 300 MG capsule, Take 300 mg by mouth 3 (three) times daily, Disp: , Rfl:   .  HumaLOG KwikPen 100 UNIT/ML Solution Pen-injector injection pen, INJECT 5 UNITS SUBCUTANEOUSLY THREE TIMES DAILY WITH MEALS. SLIDING SCALE ADD 5 UNITS FOR EVERY ADDITIONAL 50 POINTS ABOVE 150 GLUCOSE LEVEL. MAX 50 UNITS PER DAY, Disp: , Rfl:   .  metFORMIN (GLUCOPHAGE) 500 MG tablet, Take 1 tablet by mouth 2 (two) times daily, Disp: , Rfl:   .  RELION PEN NEEDLE 31G/8MM 31G X 8 MM Misc, USE AS DIRECTED, Disp: , Rfl:   .  Semglee, yfgn, 100 UNIT/ML Solution, INJECT 24 UNITS SUBCUTANEOUSLY ONCE DAILY AT BEDTIME. DISCARD OPEN VIAL AFTER 28 DAYS, Disp: , Rfl:     Allergies:  Not on File  None    Past Medical History:  Past Medical History:   Diagnosis Date   . Above-knee amputation of left lower extremity    . Amputation of great toe, left, traumatic    . Below-knee amputation of left lower extremity    . Diabetic acidosis, type II    . Hypertension        Past Surgical History:  History reviewed. No pertinent surgical history.    Family History:  Family History   Problem Relation Age of Onset   . No known problems Mother    . No known problems Father    . No known problems Sister    . No known problems Brother    . No known problems Maternal Grandmother    . No known problems Maternal Grandfather    . No known problems Paternal Grandmother    . No known problems Paternal Grandfather    . No known problems Other        Social History:  Social History     Socioeconomic History   . Marital status: Single   Tobacco Use   . Smoking status: Never Smoker   . Smokeless tobacco: Never  Used   Vaping Use   . Vaping Use: Never used   Substance and Sexual Activity   . Drug use: Never         Review Of Systems:    ROS:  General: no fevers, chills, or weight changes  Eyes: no vision changes  HENT: no dysphagia or sore throat  Cardiac: no chest pain   Resp: no shortness of breath  GI: no bowel incontinence, nausea, or vomiting  GU: no bladder incontinence or retention  Ext: no edema or calf tenderness  MSK: Left AKA  Neuro: no weakness, numbness or paresthesias  Psych: no depression or anxiety  Skin: no rashes or ulcers  Remaining 12 point review of systems negative except where noted in HPI.      Physical Examination:    Vitals:    11/05/20 0843   BP: (!) 120/92         NAD  I do not find a depressed or anxious state.  Alert, appropriately responsive  No rashes on exposed skin.  Patient's eyes appear normal, pupils not dilated  Respirations are non-labored  Pt oriented to time place and person  Pt has intact recent and remote memory  Judgment and insight intact  No clubbing or cyanosis of finger nails    LLE:  Strength 5/5  Skin intact, incision well healed  Non-TTP over distal residuum  ROM at the hip is full      Lab Review:  No results found for this or any previous visit (from the past 720 hour(s)).      Radiology Review:          Impression:  1. Impaired mobility     2. S/P AKA (above knee amputation), left           Plan:   - 60 year old male with a left above knee amputation secondary to diabetic wound complications.  Residuum is well healed and healthy. There are no contraindications for use of a prosthesis.  Patient previously independent with all mobility and highly motivated to return to a higher level of functional mobility.  Patient at a K2 level with possible K3 potential once he works with physical therapy utilizing his prosthesis.   - Left above knee prosthesis, associated supplies and consumables ordered  today   - Referral to PT provided - [facility]    FU after a few  weeks of PT to monitor progress.      Levin Erp, MD    Thank you for allowing me to be involved in the care of this patient.  If you have any questions please contact me at [facility] at [phone]    CC: Referring Provider

## 2021-05-11 IMAGING — US LIVER ELASTOGRAPHY
1 series · 9 of 9 positions shown · non-contrast
Comparison: CT of abdomen 07/22/2020.

HISTORY: 60 year-old male with steatohepatitis.

[Series 1: liver elastography · 9 of 9 slices shown]
[im 1/9]
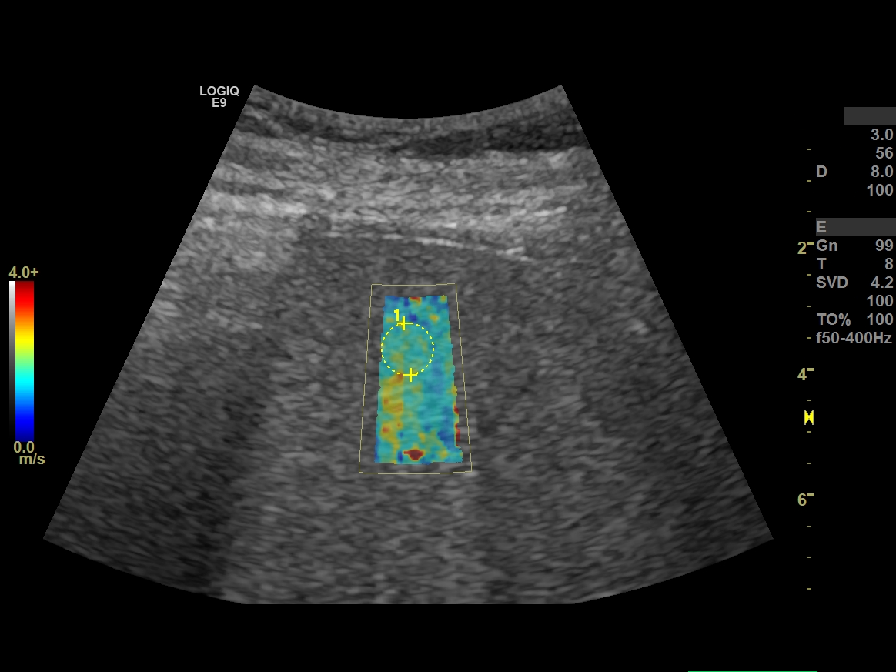
[im 2/9]
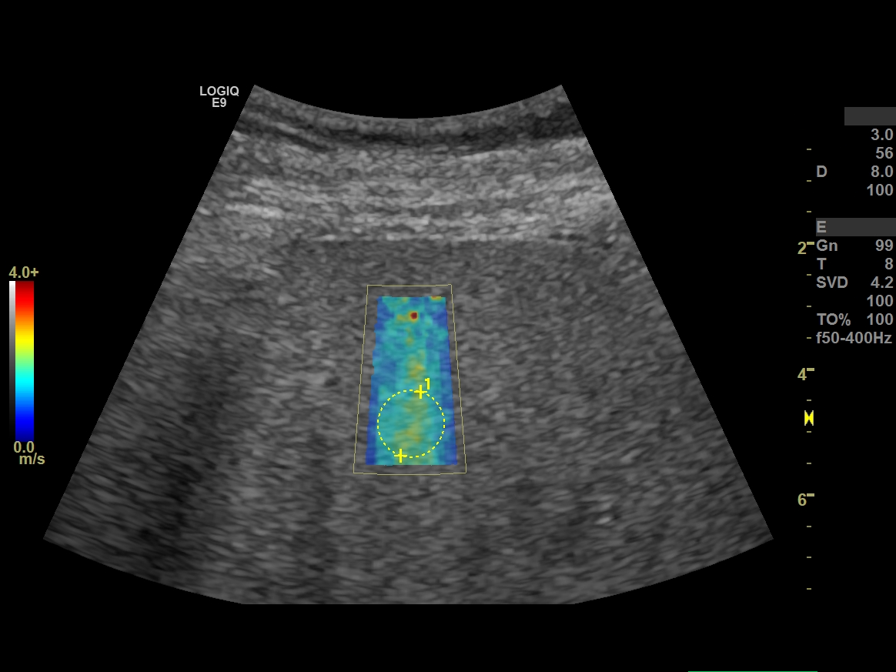
[im 3/9]
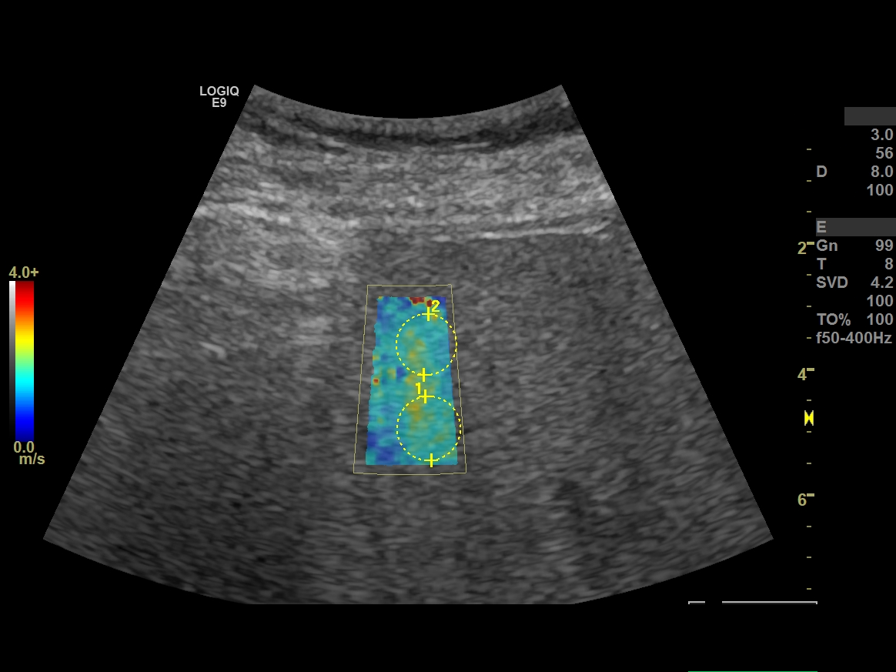
[im 4/9]
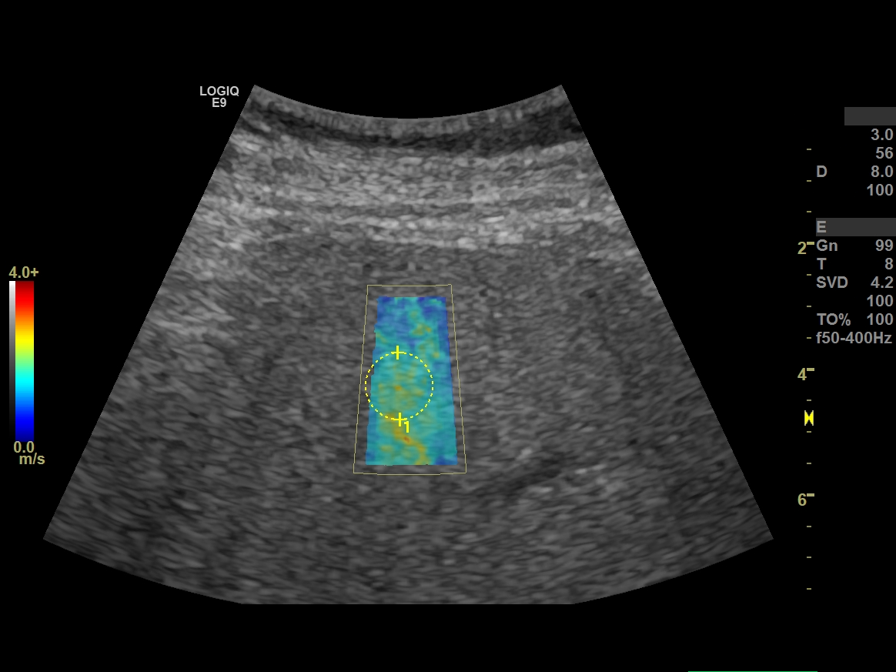
[im 5/9]
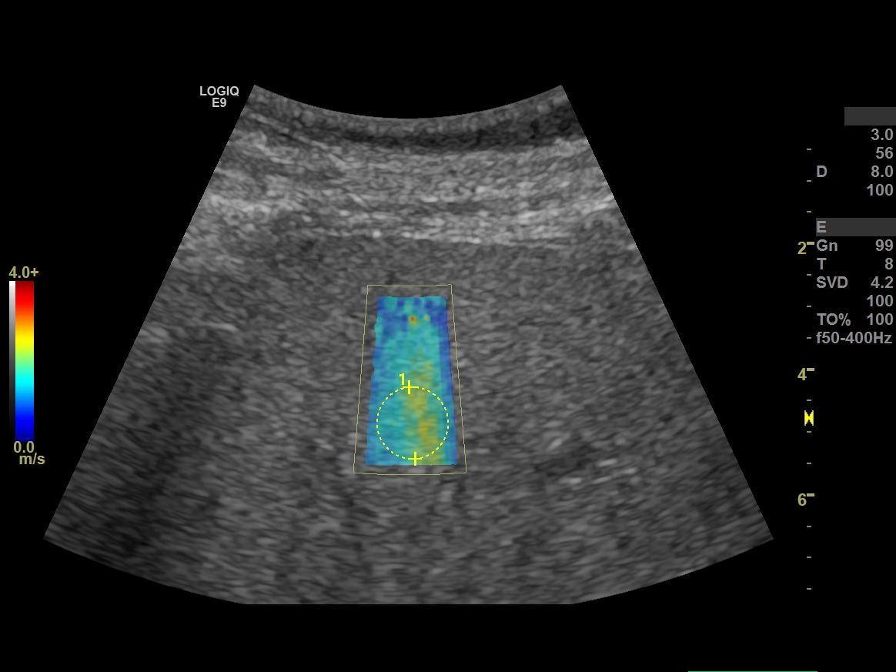
[im 6/9]
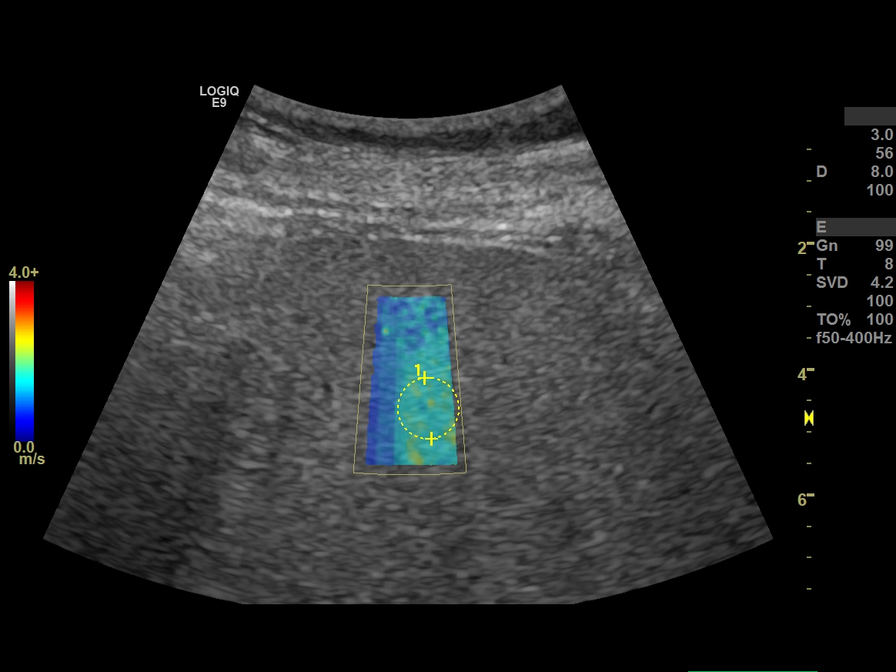
[im 7/9]
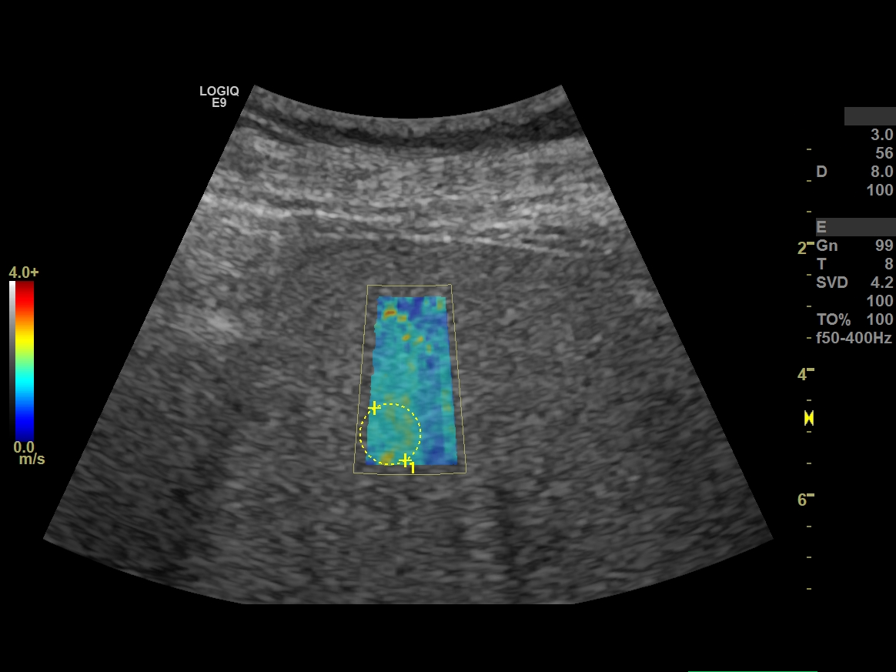
[im 8/9]
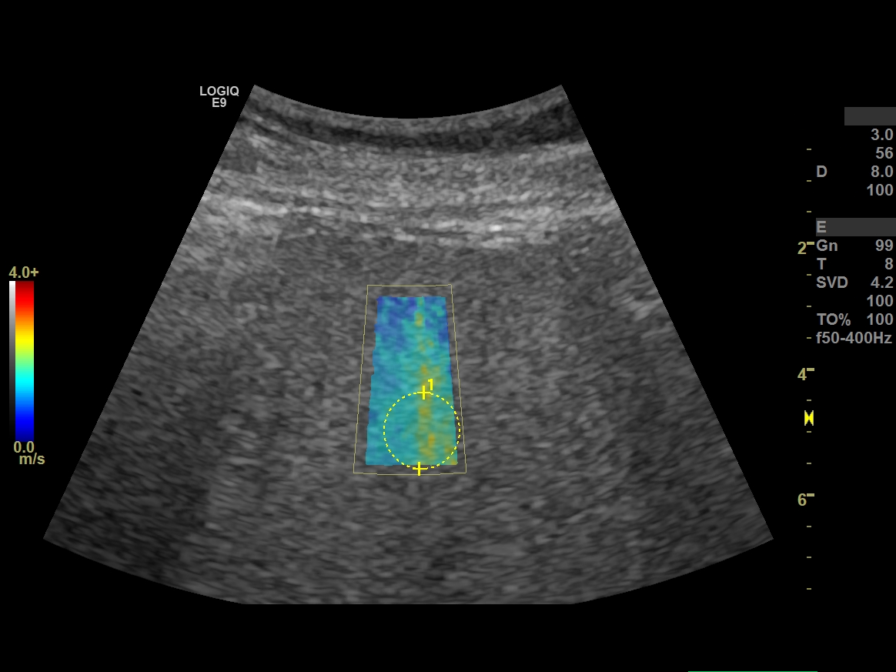
[im 9/9]
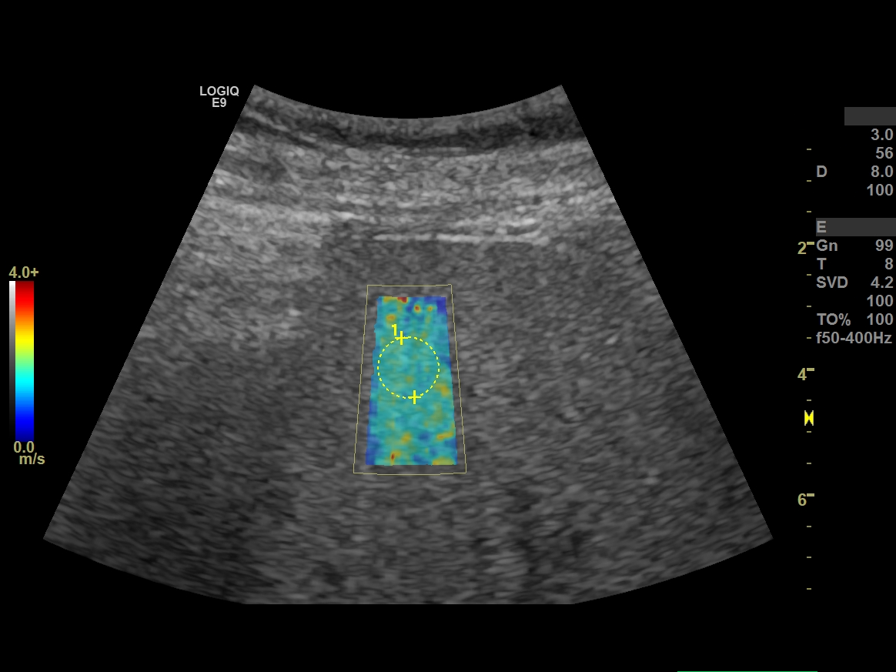

[9 of 9 positions shown; findings below may reference images not displayed]

Abdominal ultrasound 1 05/17/2021.

US ELASTOGRAPHY E9

10  shear wave measurements were acquired using a right intercostal approach, with median of 1.69 m/s (range, 1.60 - 1.77 m/s). The IQR/median value is 7.0%. Measurements were obtained using a C1-6 transducer.
IMPRESSION: 1. Liver fibrosis staging, MILD-MODERATE.

2. Median liver shear wave speed = 1.69 m/s.

3. METAVIR SCORE F2.

GE E9 Shear Wave Elastography Reference Ranges

Liver Fibrosis Staging:     METAVIR SCORE                    m/s

Normal-Mild                        F1                              1.35-1.66 m/s

Mild-Moderate                    F2                              1.66-1.77 m/s

Moderate-Severe               F3                              1.77-1.99 m/s

Cirrhosis                            F4                                     >1.99 m/s

Note, ultrasound shear wave measurements may also be affected by presence of hepatic congestion, steatosis and inflammation. Shear wave speed measurements should be interpreted in conjunction with other available clinical data, and they should not be considered interchangeable with MRI- derived stiffness measurements at this time.

## 2021-06-23 IMAGING — CT CT CSPINE WO CONTRAST
3 of 4 series · 10 of 33 positions shown, 12 images · non-contrast
Comparison: MRI cervical spine 06/20/20, radiograph cervical spine 06/23/21

HISTORY: Radiculopathy, cervical region
TECHNIQUE: CT images of the cervical spine were obtained without intravenous contrast administration. Coronal and sagittal reconstructed images are obtained. 

Dose reduction technique used: Automated exposure control and adjustment of the mA and/or kV according to patient size. CT count in previous 12-months: 1

[Series 7: coronal · coronal · 0.27mm/px · 3 of 70 slices shown]
[im 14/70  bone]
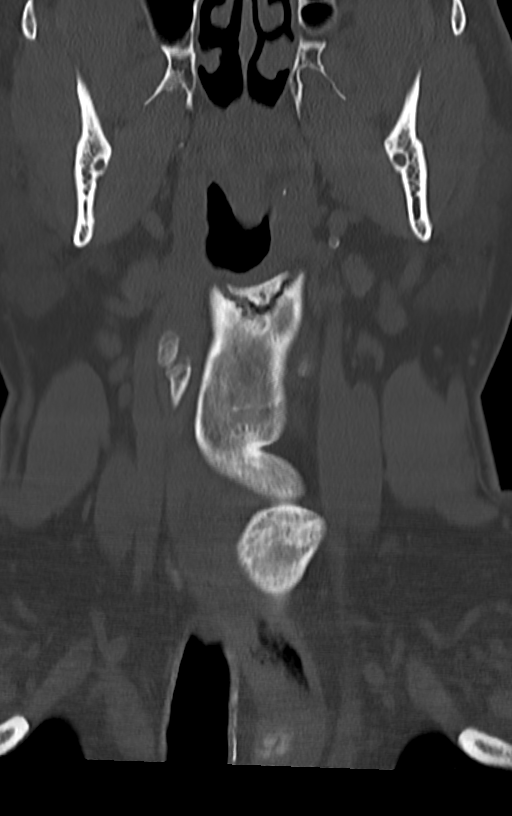
[im 28/70  bone]
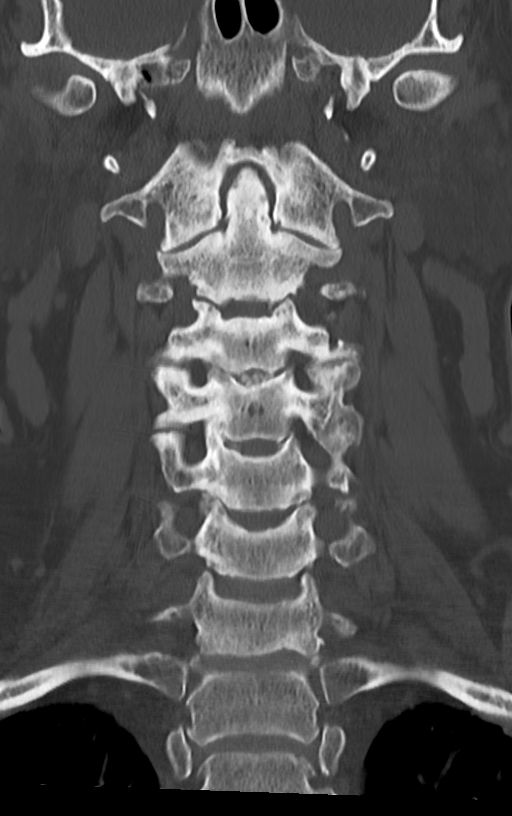
[im 42/70  bone]
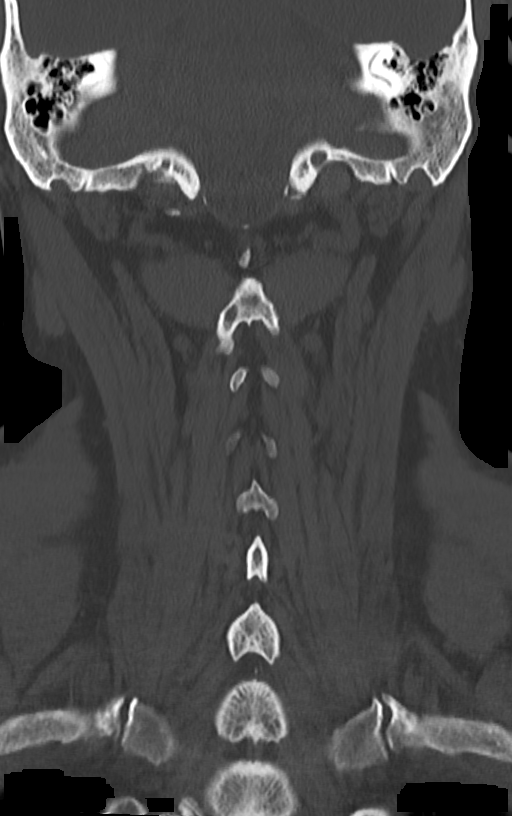

[Series 9: sagittal · sagittal · 0.27mm/px · 5 of 70 slices shown, 6 images]
[im 24/70  bone]
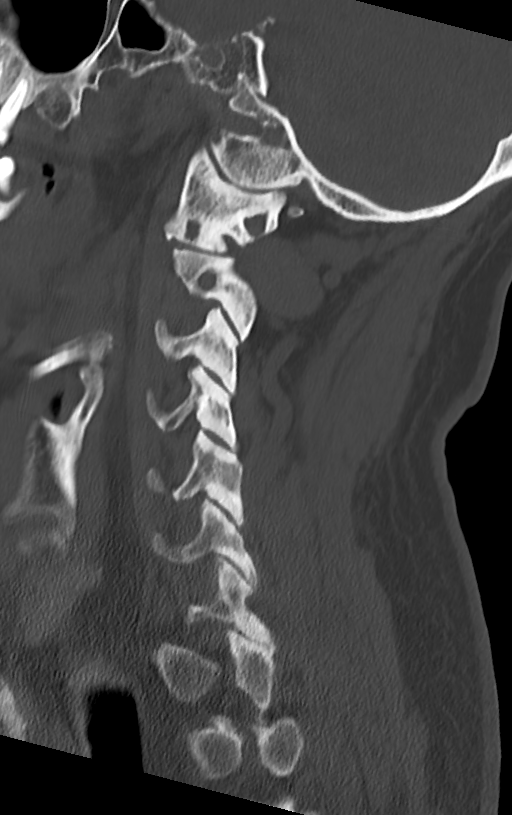
[im 29/70  bone]
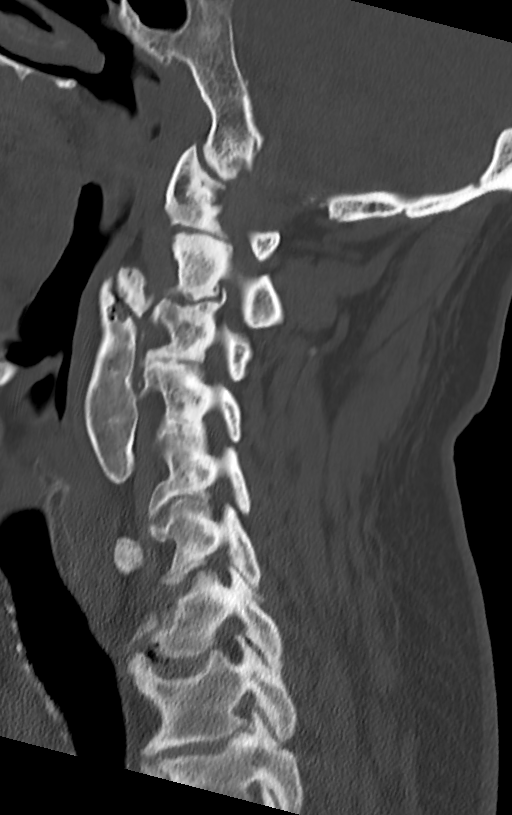
[im 35/70  soft-tissue]
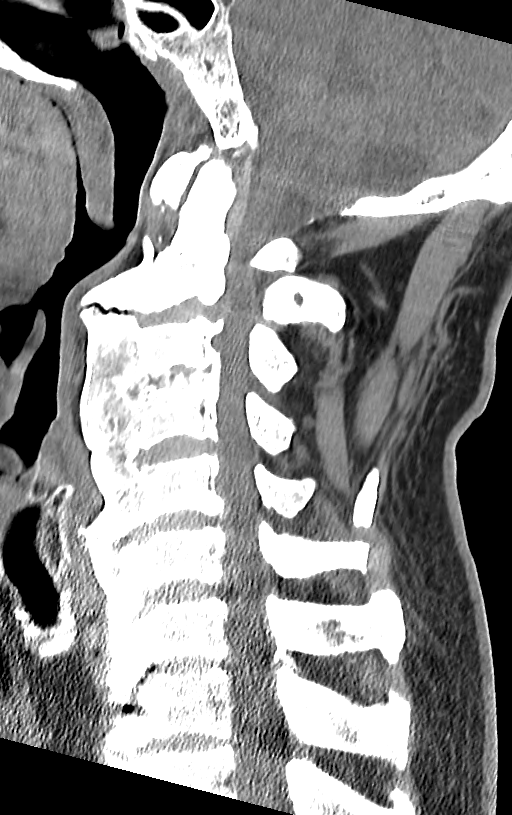
[im 35/70  bone]
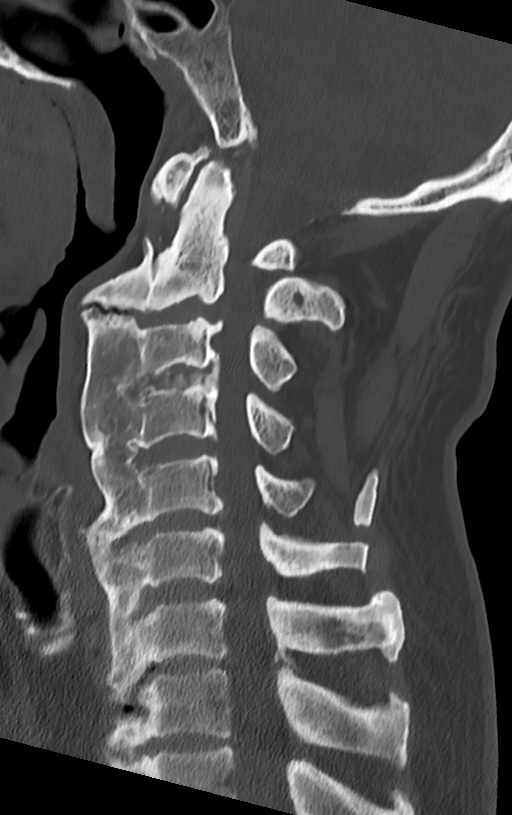
[im 41/70  bone]
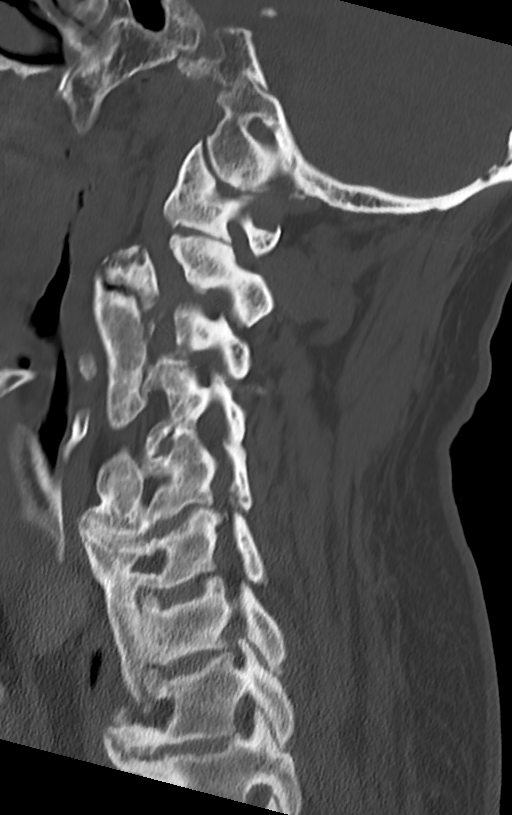
[im 47/70  bone]
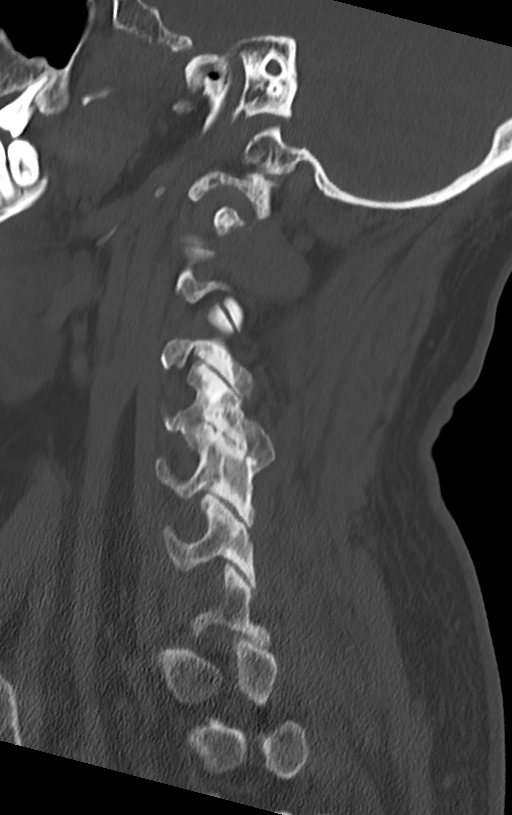

[Series 11: spine ranges · axial · 0.31mm/px · z∈[-973,-907]mm · 2 of 101 slices shown, 3 images]
[im 34/101  soft-tissue]
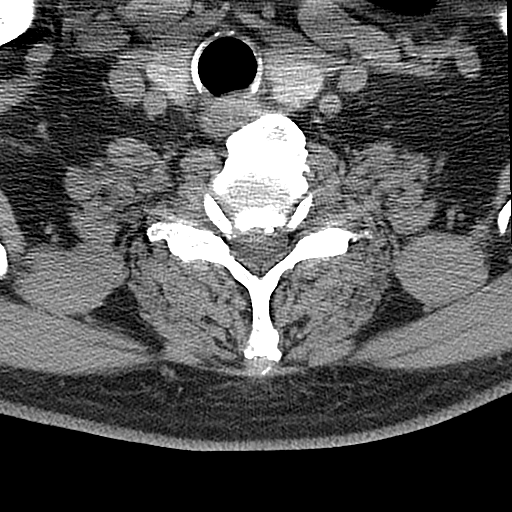
[im 34/101  bone]
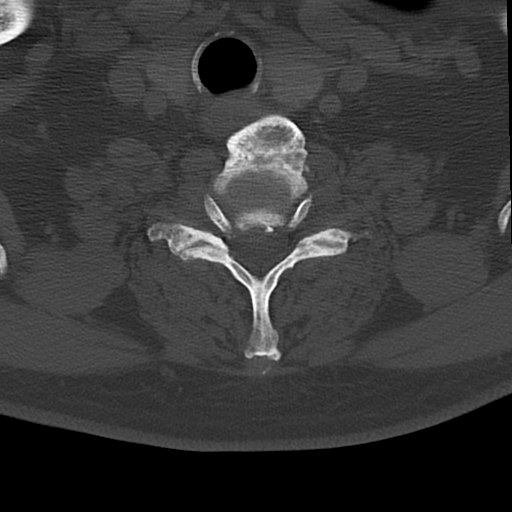
[im 67/101  bone]
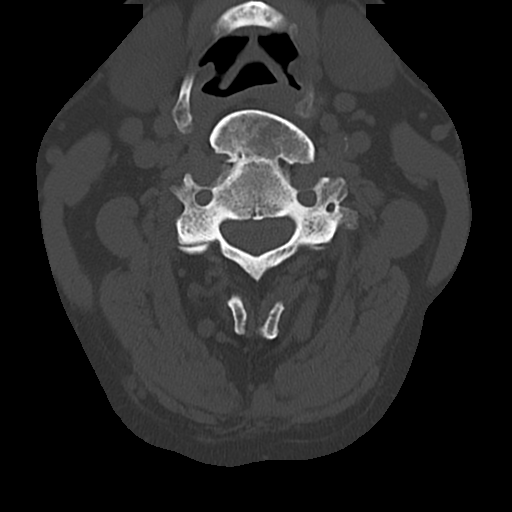

[10 of 33 positions shown; findings below may reference images not displayed]

FINDINGS: Scout: Unremarkable. 

Alignment: Straightening of the cervical lordosis.  

Vertebrae and discs: Bulky flowing anterior vertebral osteophytes from C2 to C7 consistent with DISH. Mild ossification of the posterior longitudinal ligament from C3 to C6. Hypoplastic C1 arch. Developmental canal stenosis on the basis of congenitally shortened pedicles. No acute fracture.

C1-C2: Severe canal stenosis secondary to hypoplastic C1 arch.

C2-3: Disc osteophyte complex. Moderate uncovertebral arthrosis. Moderate to severe canal and severe right neural foraminal stenosis.

C3-4: Mild ossification of the posterior longitudinal ligament. Disc osteophyte complex. Moderate uncovertebral and moderate left facet arthrosis. Moderate to severe canal and mild bilateral neural foraminal stenosis.

C4-5: Mild ossification of the posterior longitudinal ligament. Disc osteophyte complex. Moderate uncovertebral and severe left facet arthrosis. Moderate canal and mild bilateral neural foraminal stenosis.

C5-6: Mild ossification of the posterior longitudinal ligament. Disc osteophyte complex. Moderate uncovertebral arthrosis. Mild canal and severe bilateral neural foraminal stenosis.

C6-7: Disc osteophyte complex. Mild uncovertebral arthrosis. Mild canal and severe right neural foraminal stenosis.

C7-T1: Disc osteophyte complex. Mild uncovertebral arthrosis. Mild canal, severe left and mild right neural foraminal stenosis.

The paravertebral soft tissues are unremarkable.
IMPRESSION: 1.
Severe canal stenosis at C1-C2.

2.
Moderate to severe canal stenosis at C2-C3 and C3-C4. 

3.
Moderate canal stenosis at C4-C5.

4.
Severe neural foraminal stenosis at C2-C3 on the right, C5-C6 bilaterally, C6-C7 on the right and C7-T1 on the left. 

5.
Bulky flowing anterior vertebral osteophytes from C2 to C7 consistent with DISH. Mild ossification of the posterior longitudinal ligament.

Total radiation dose to patient is CTDIvol 16.80 mGy and DLP 415.00 mGy-cm.

## 2021-06-23 IMAGING — CR C-SPINE 4 or 5 views
1 series · 7 of 7 positions shown · non-contrast
Comparison: June 19, 2020.

REASON FOR EXAM: Radiculopathy.

[Series 1: lat · 0.17mm/px · 7 of 7 slices shown]
[im 1/7]
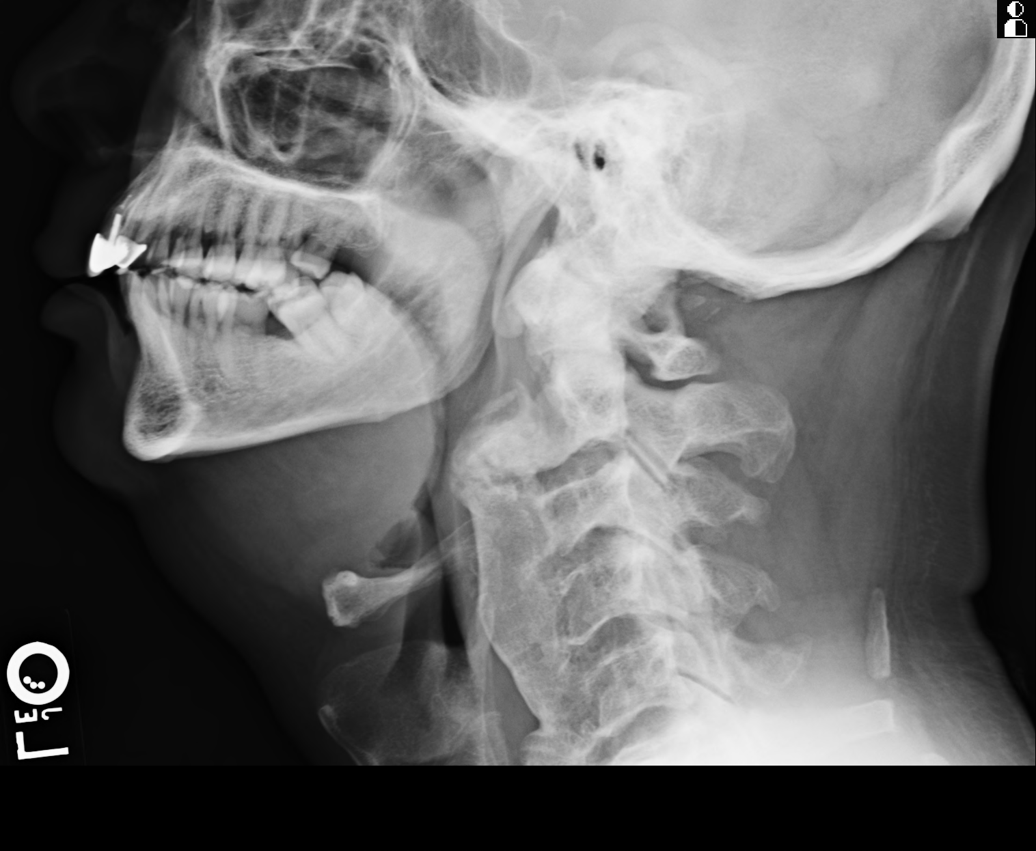
[im 2/7]
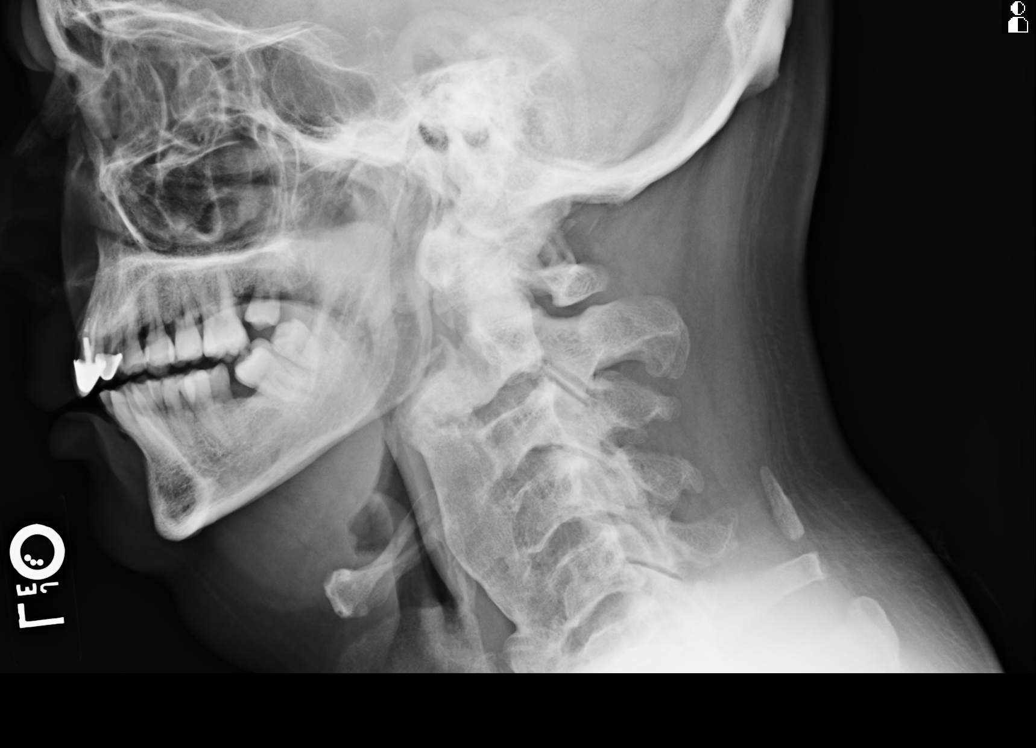
[im 3/7]
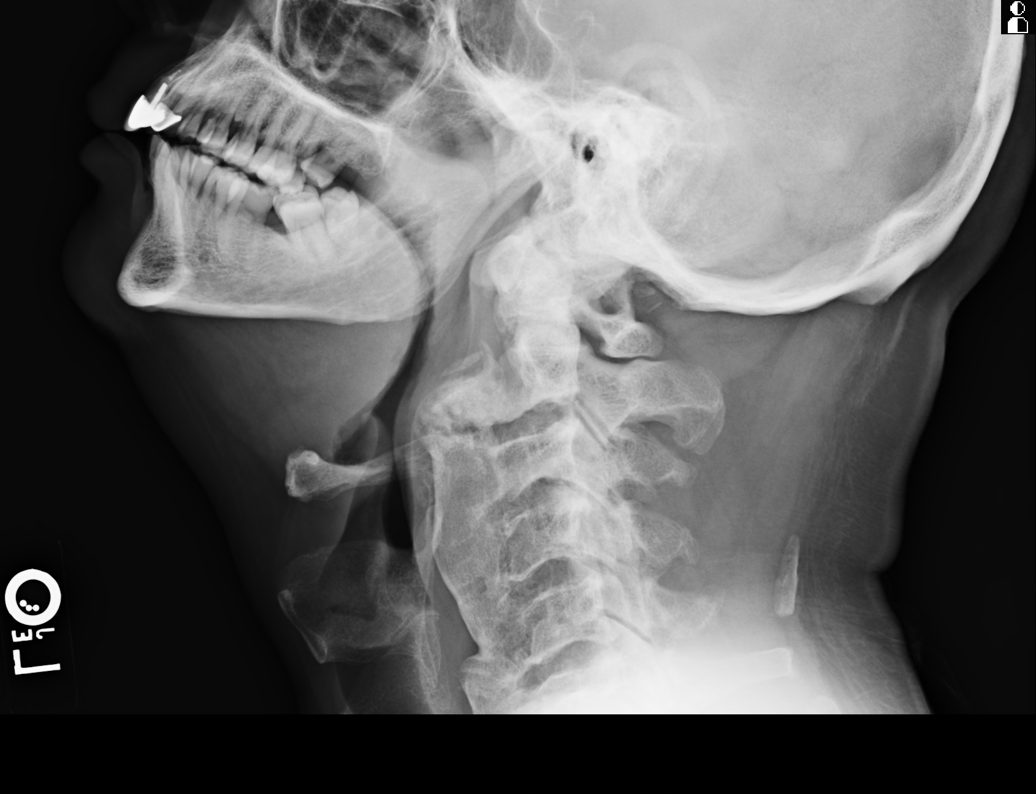
[im 4/7]
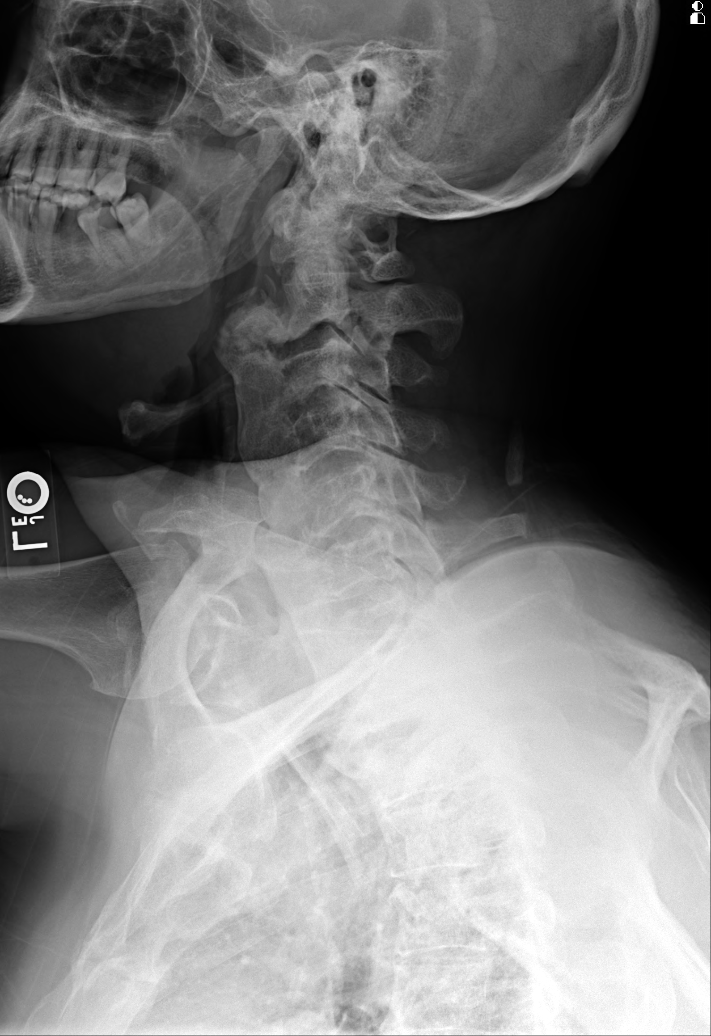
[im 5/7]
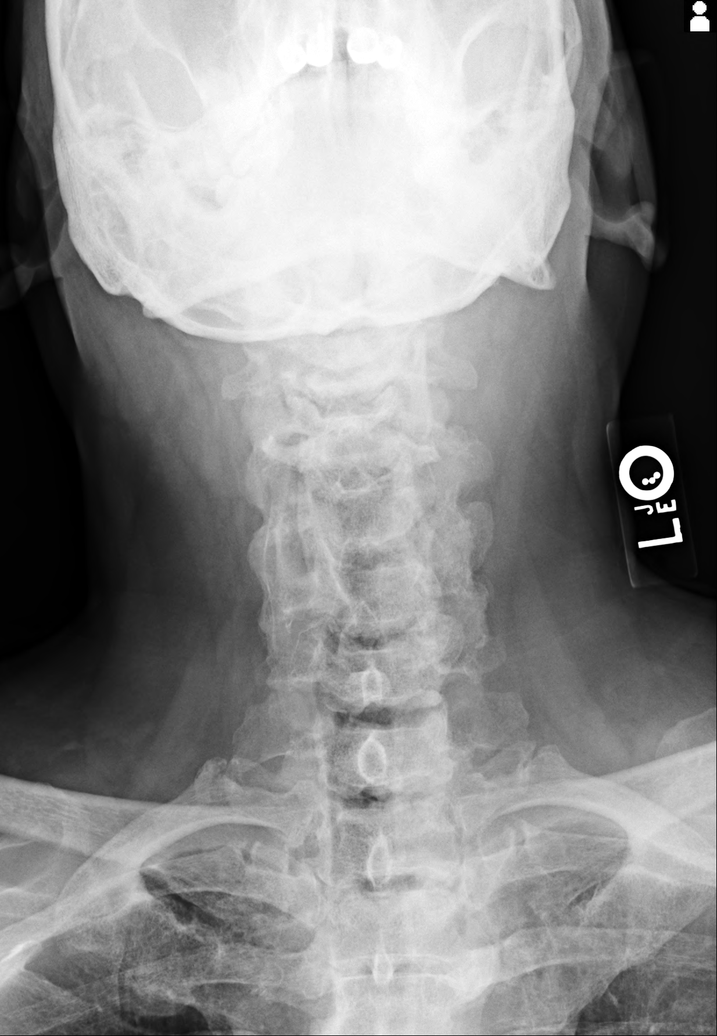
[im 6/7]
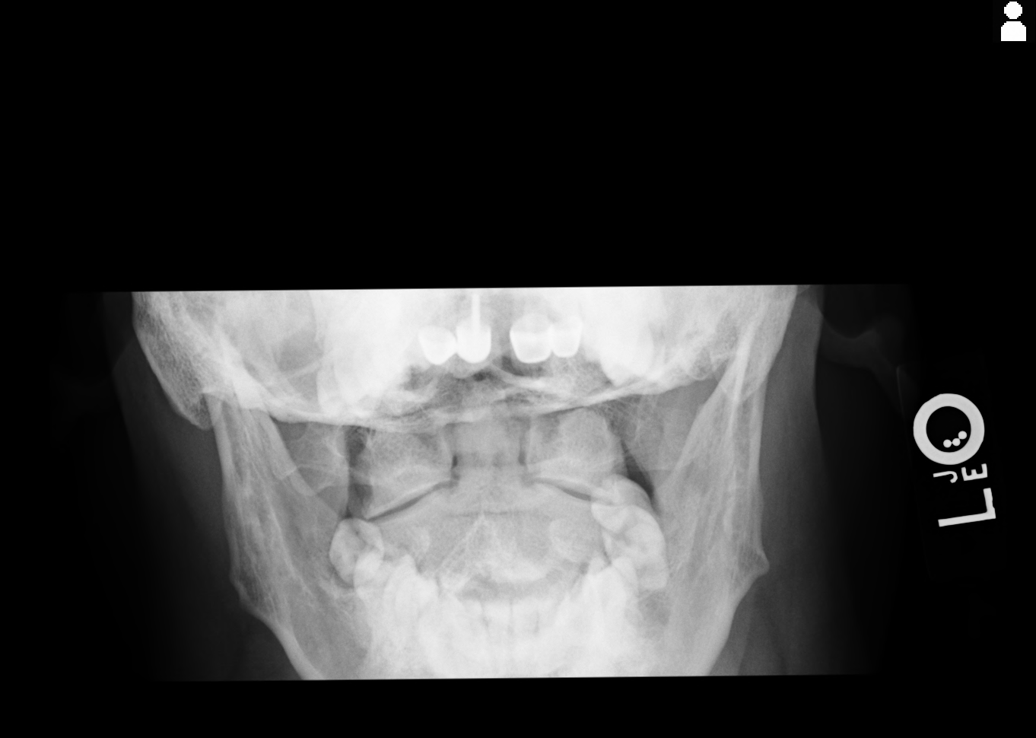
[im 7/7]
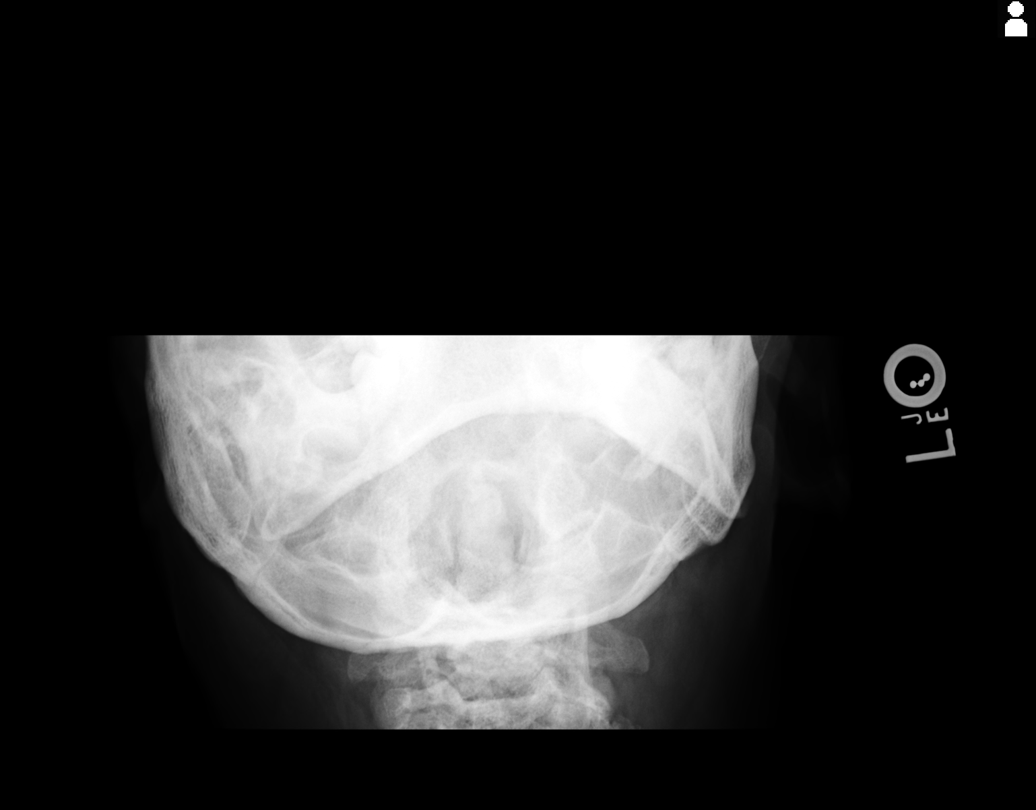

[7 of 7 positions shown; findings below may reference images not displayed]

TECHNIQUE AND FINDINGS: 7 views of the cervical spine were obtained including flexion and extension lateral views. 

The patient has a thick and fused ossification from C2 down through C6 or below. The width of this ossification is almost as great as the vertebral bodies themselves. With flexion and extension there is no evidence of instability. Base of dens is intact. Facets are in good alignment.
IMPRESSION: Extensive ossification anterior to the cervical spine likely related to diffuse idiopathic skeletal hyperostosis.

## 2021-12-13 IMAGING — US US GALLBLADDER
1 series · 14 of 19 positions shown · non-contrast
Comparison: None.

Images Obtained from [facility]
HISTORY: 60-year-old male with right upper quadrant pain.
TECHNIQUE: Using real-time and Doppler ultrasound, the gallbladder was evaluated.

[Series 1: us gallbladder · 14 of 19 slices shown]
[im 1/19]
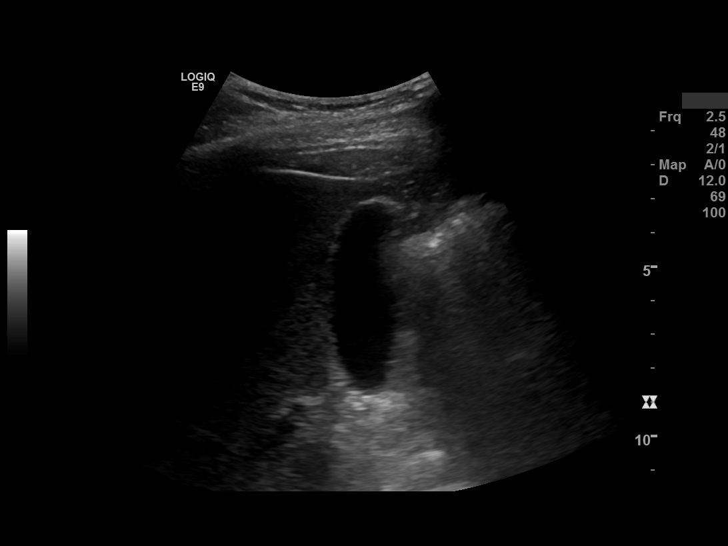
[im 3/19]
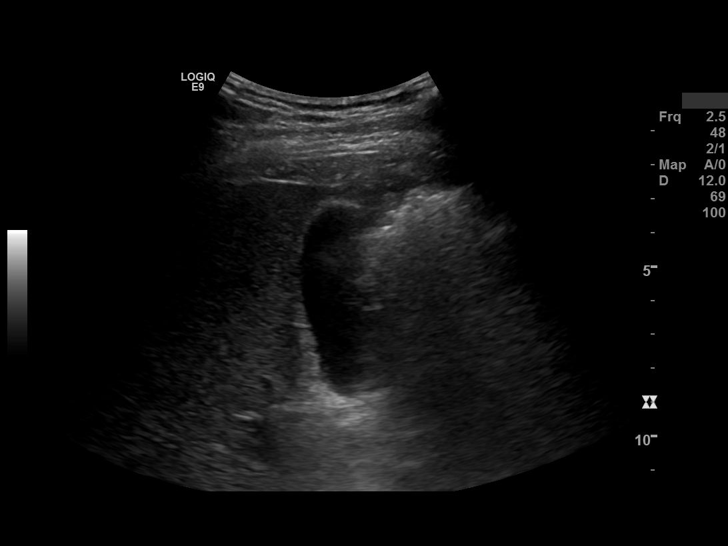
[im 4/19]
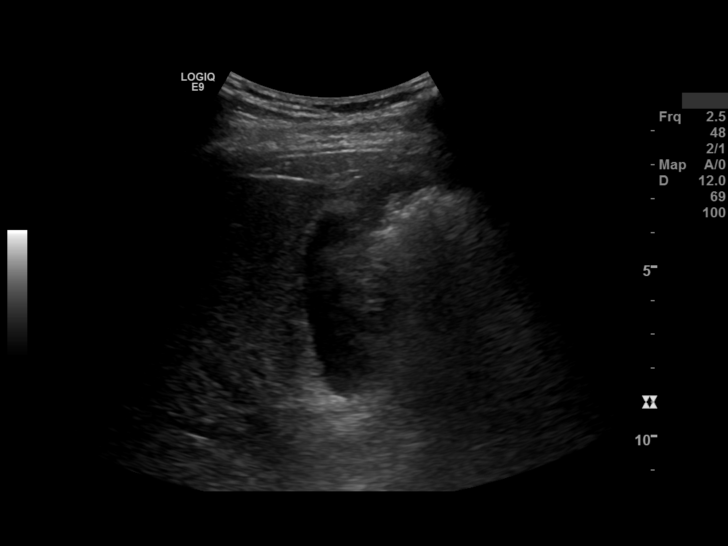
[im 5/19]
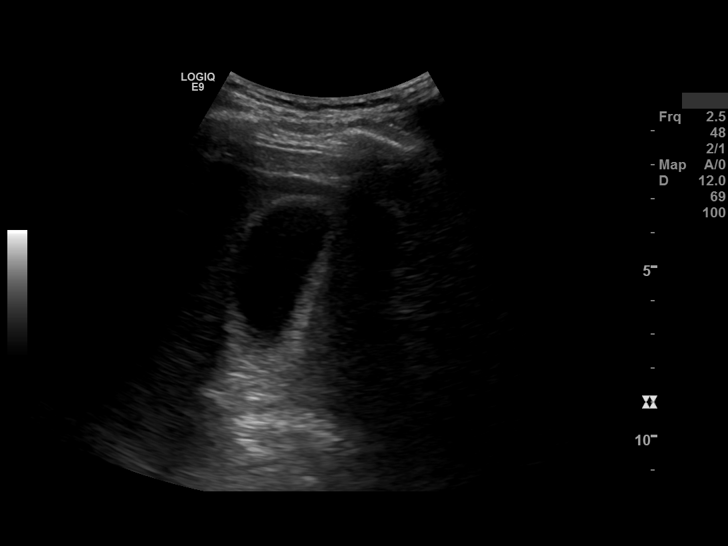
[im 7/19]
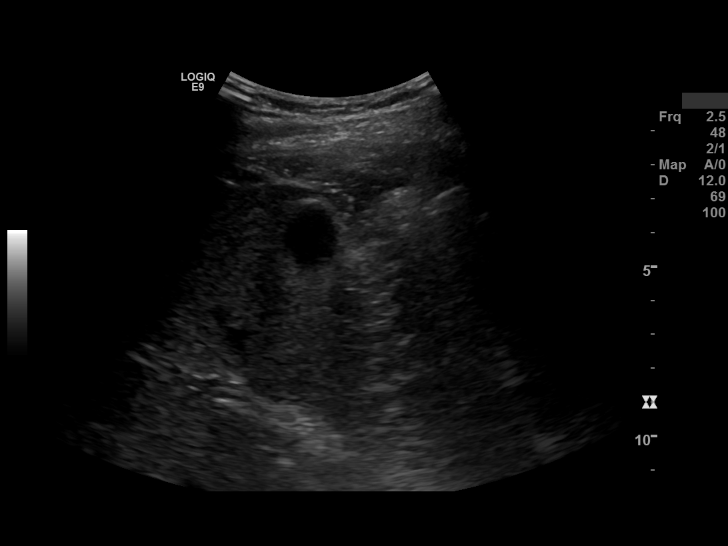
[im 8/19]
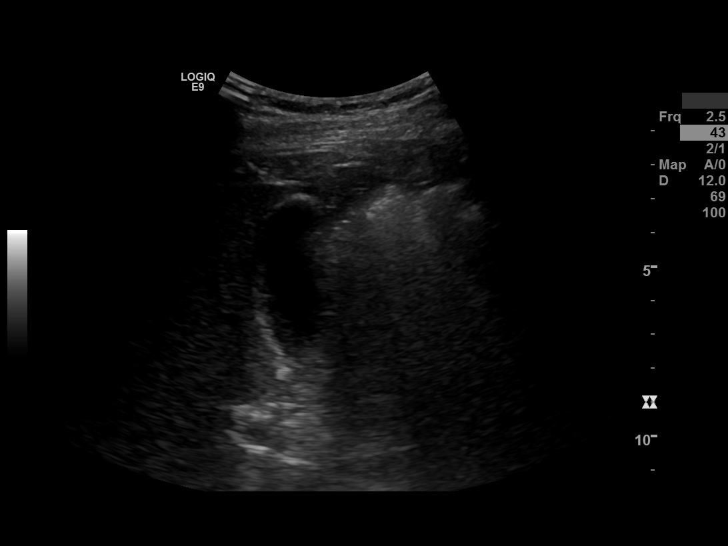
[im 9/19]
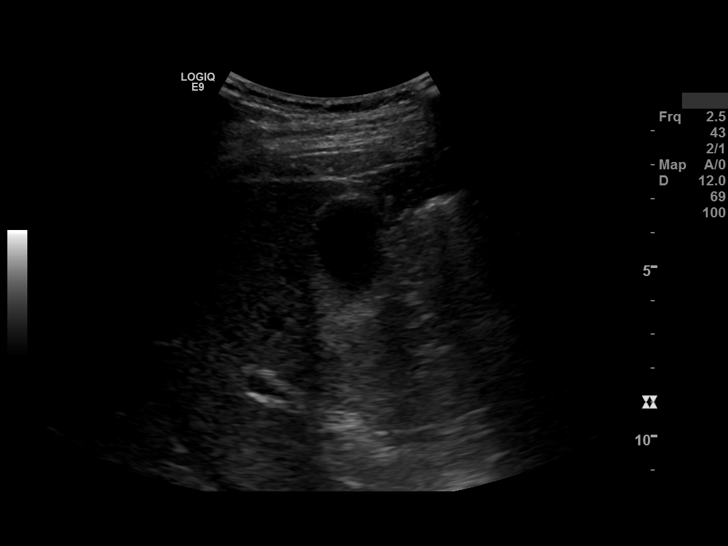
[im 11/19]
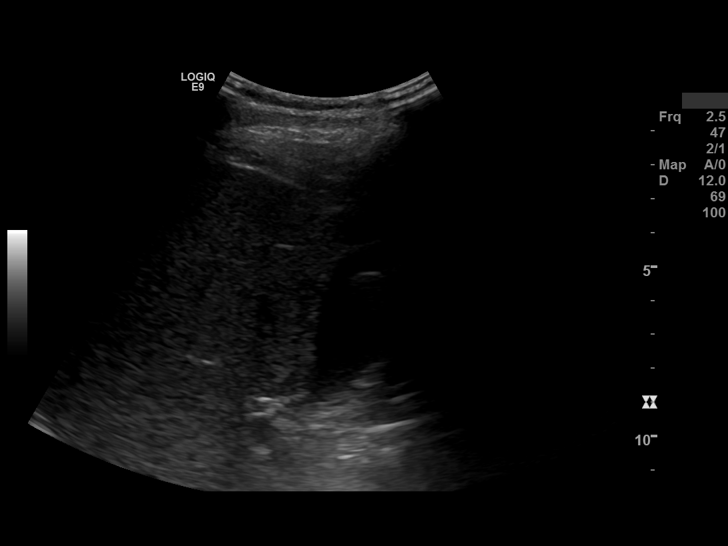
[im 12/19]
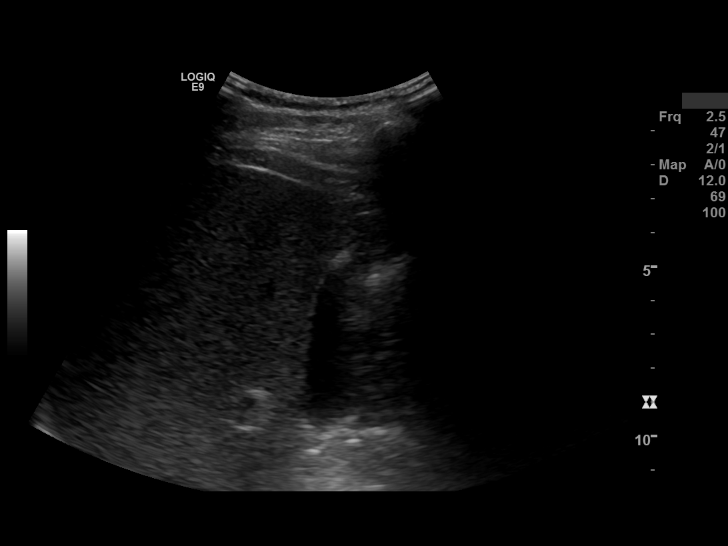
[im 13/19]
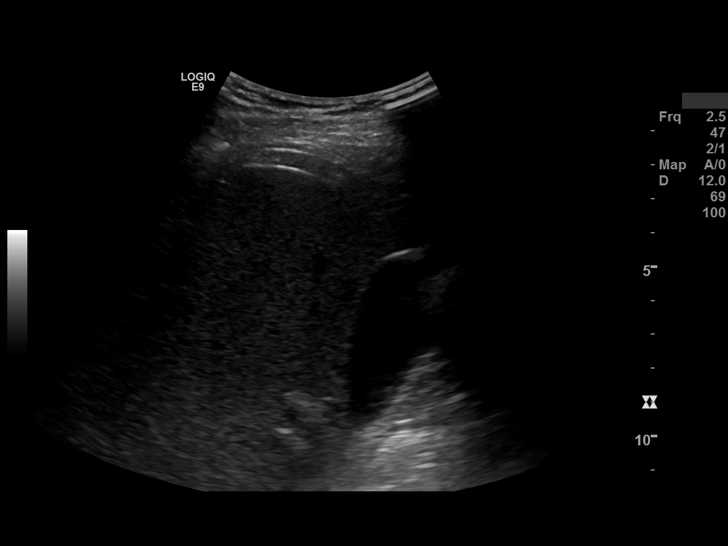
[im 15/19]
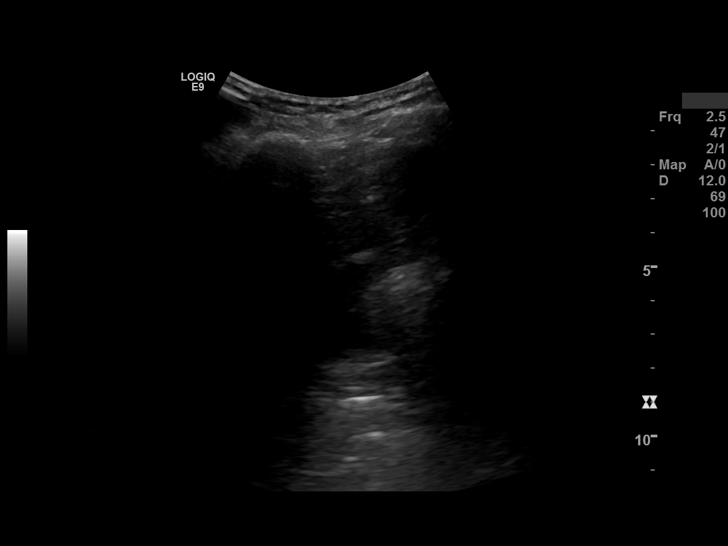
[im 16/19]
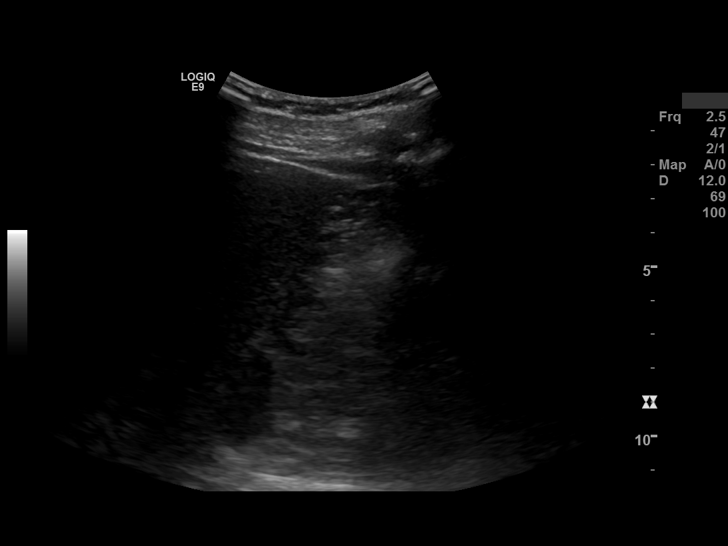
[im 17/19]
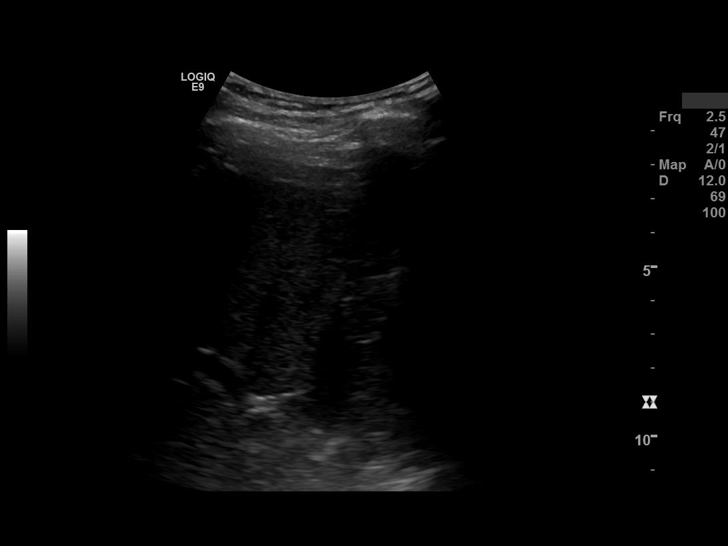
[im 19/19]
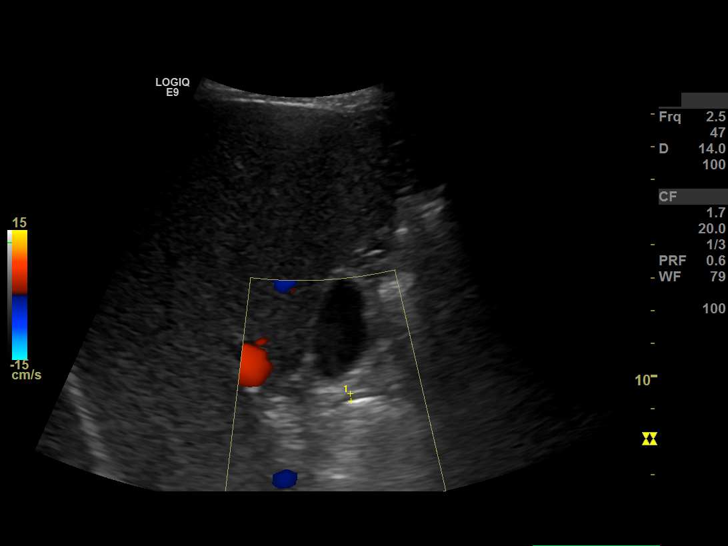

[14 of 19 positions shown; findings below may reference images not displayed]

FINDINGS: Gallbladder: Gallbladder is unremarkable. The gallbladder wall thickness measures 2.6 mm. There is a  negative sonographic Murphy's sign. The common bile duct measures 2.25  mm.
IMPRESSION: Unremarkable gallbladder ultrasound.

## 2021-12-24 IMAGING — CR C-SPINE 4 or 5 views
1 series · 6 of 6 positions shown · non-contrast
Comparison: CT cervical spine study 06/23/2021, plain radiograph cervical spine study 06/23/2021.

Images Obtained from [facility]
HISTORY: 60-year-old Male with diseases of spinal cord, spinal stenosis of cervical region.
TECHNIQUE: 6 views of cervical spine including flexion and extension views.

[Series 1: ap · 0.17mm/px · 6 of 6 slices shown]
[im 1/6]
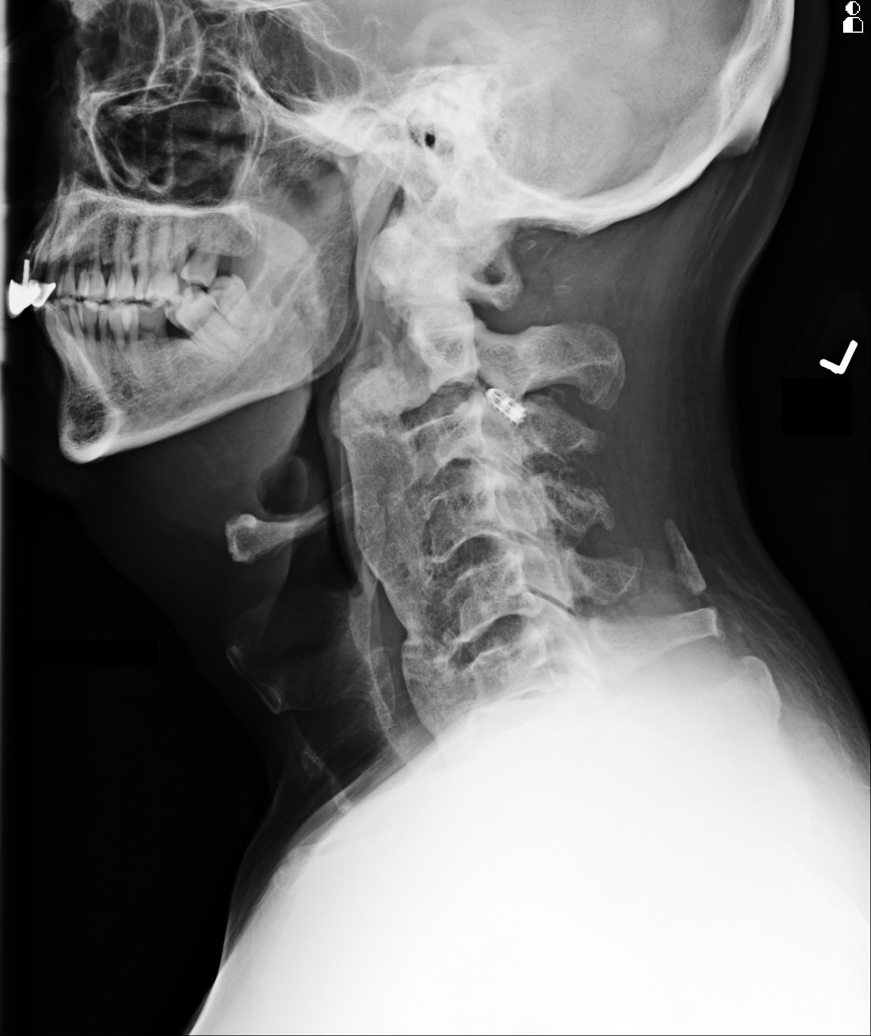
[im 2/6]
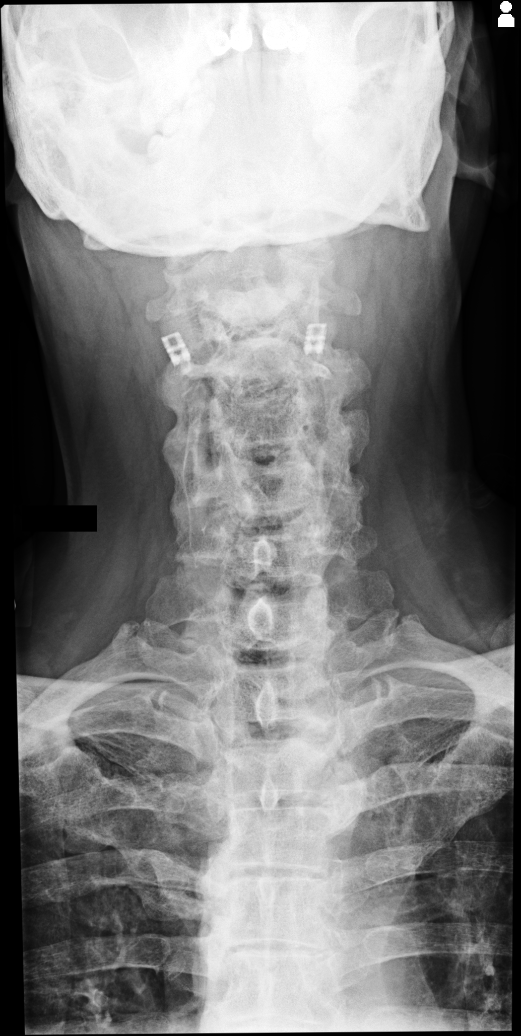
[im 3/6]
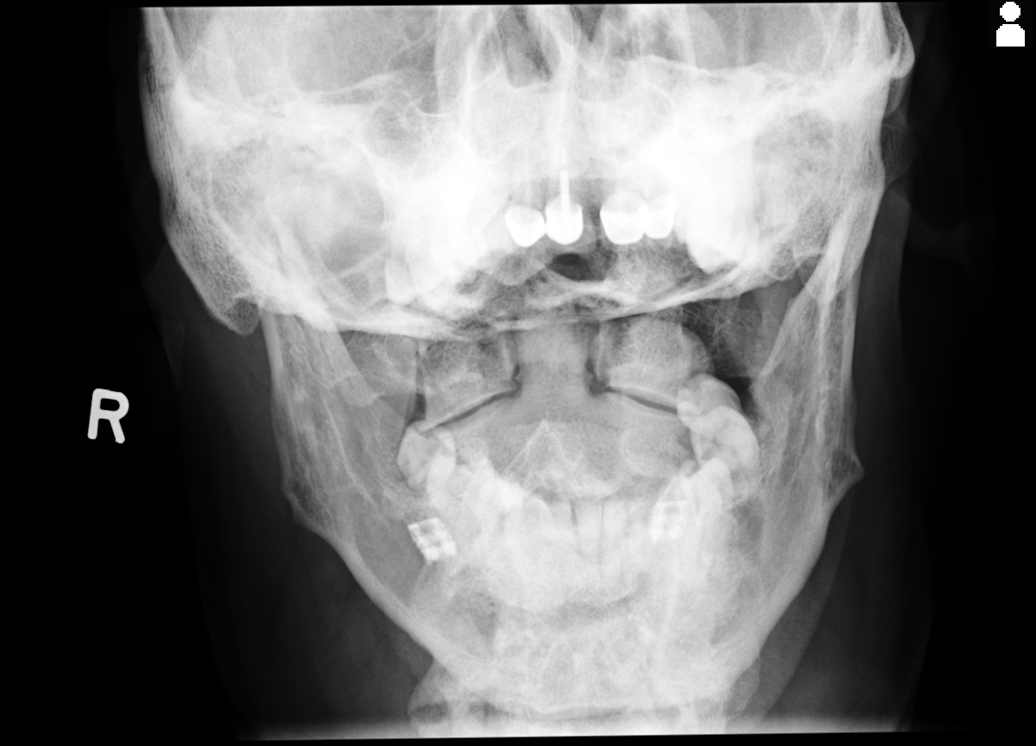
[im 4/6]
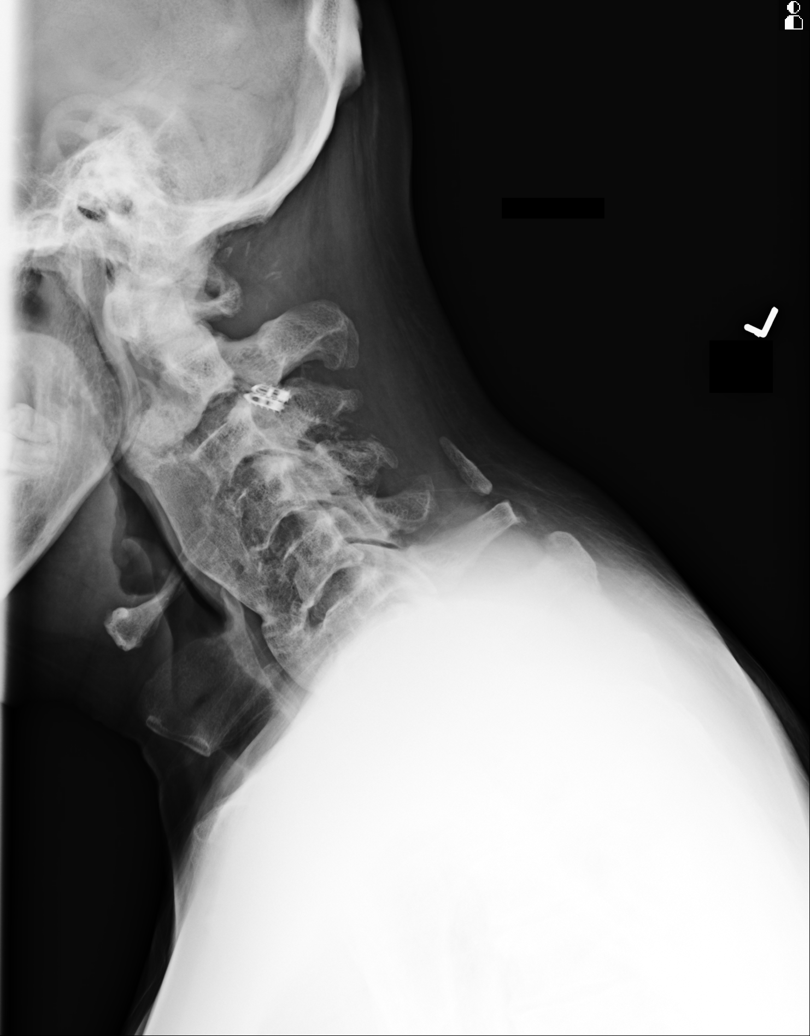
[im 5/6]
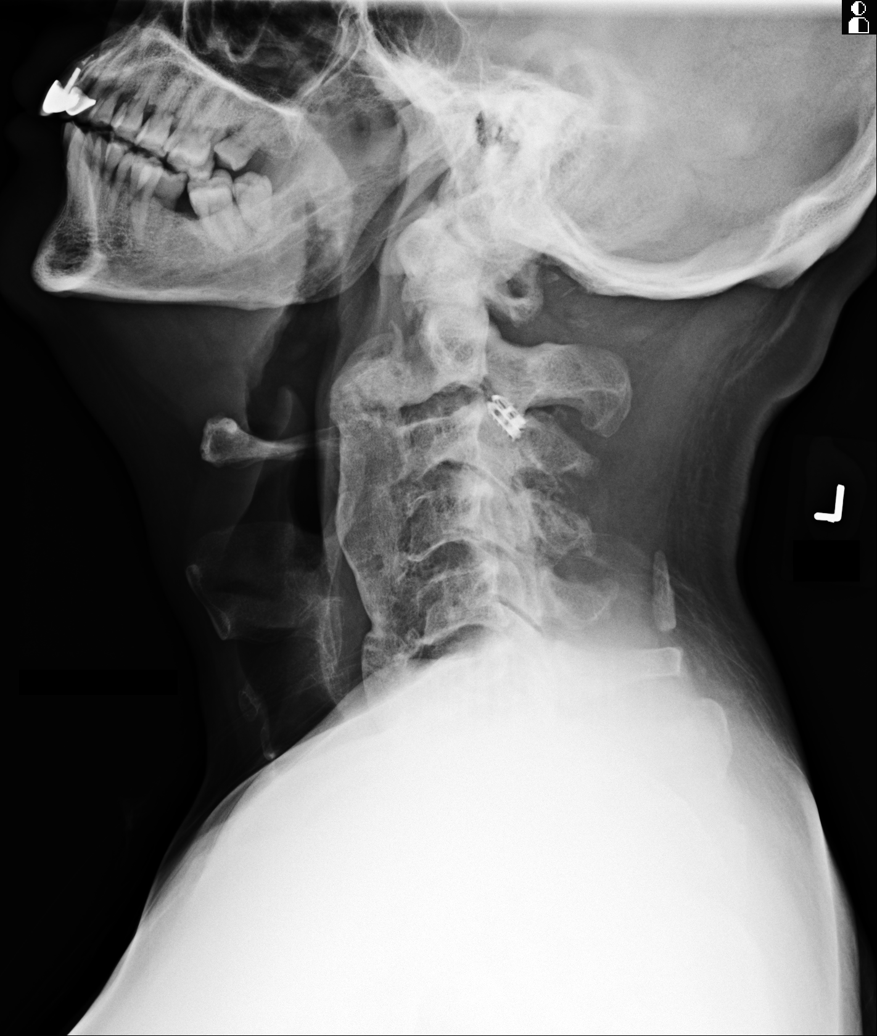
[im 6/6]
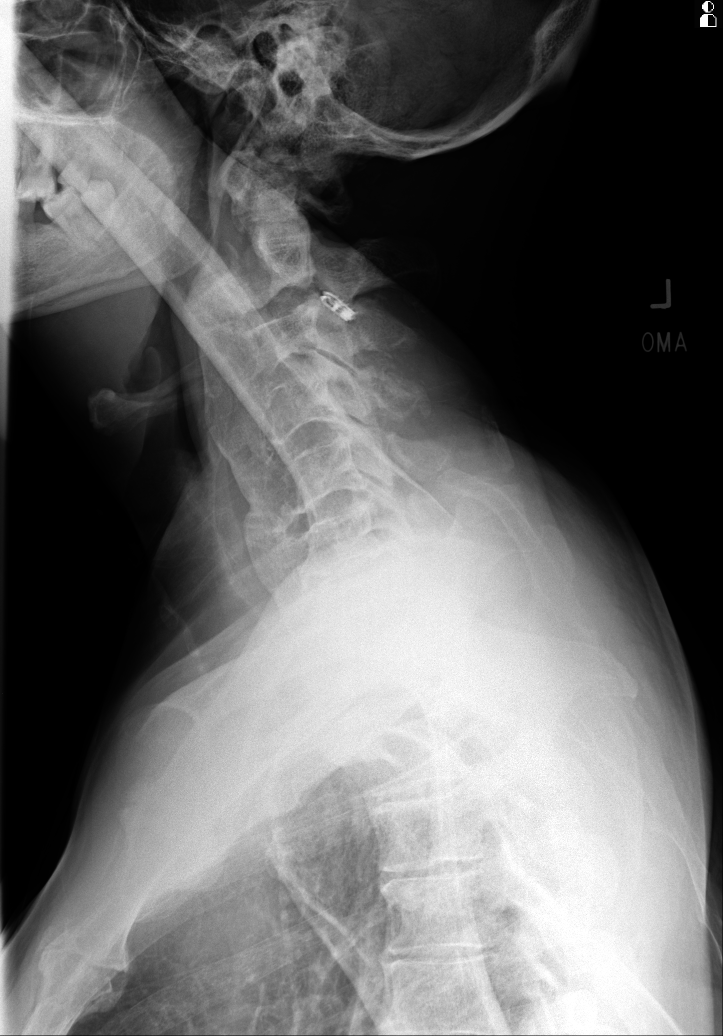

[6 of 6 positions shown; findings below may reference images not displayed]

FINDINGS: Vertebral body height and alignment: No dynamic instability between flexion and extension views.
Degenerative disc changes: None.
Facet arthrosis changes: Present.
Facet joint implants present at the C2-3 level.
Exuberant thickened bridging osteophytes throughout the cervical spine. This is compatible with DISH.
Soft tissue: The soft tissue is unremarkable.
Atherosclerotic vascular calcifications: Absent.
IMPRESSION: 1.  Exuberant thickened bridging osteophytes identified throughout the cervical spine compatible with DISH.
2.  Facet joints implants present at the C2-3 level.
3.  No dynamic instability between flexion and extension views.

## 2022-07-15 IMAGING — MR MRI SHOULDER RT WO CONTRAST
4 series · 15 of 40 positions shown · non-contrast
Comparison: MRI of the right shoulder, 06/19/2020.

Images Obtained from [facility]
INDICATION: Primary osteoarthritis, right shoulder.
TECHNIQUE: Multiplanar, multisequence MR imaging of the right shoulder without contrast.

[Series 6: t2_axial_fs · axial · right · 3.0mm · 0.26mm/px · z∈[-10,+59]mm · 6 of 22 slices shown]
[im 1/22]
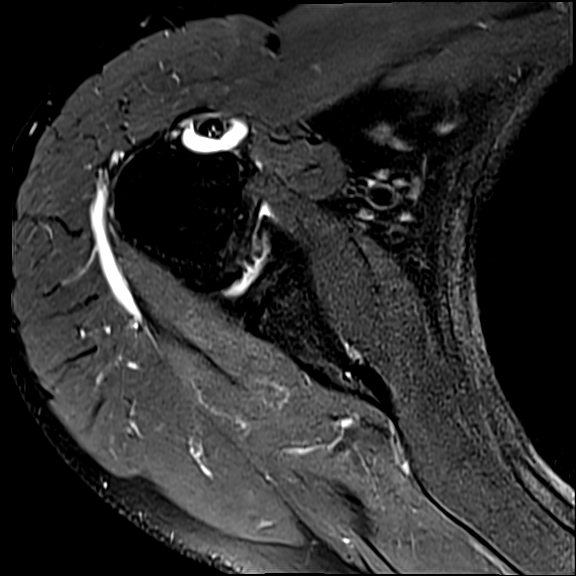
[im 4/22]
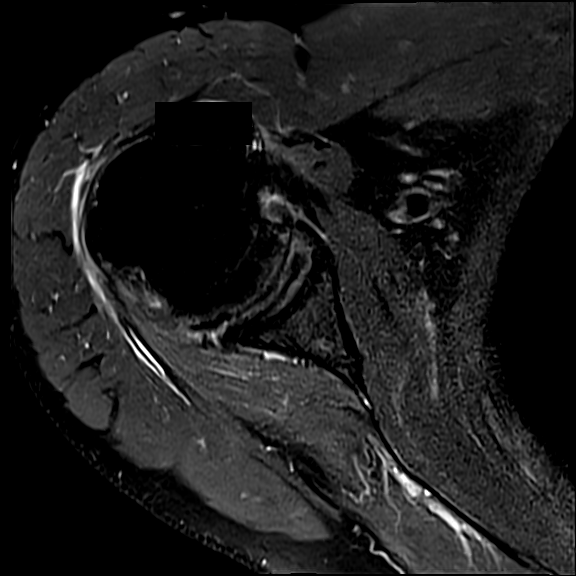
[im 7/22]
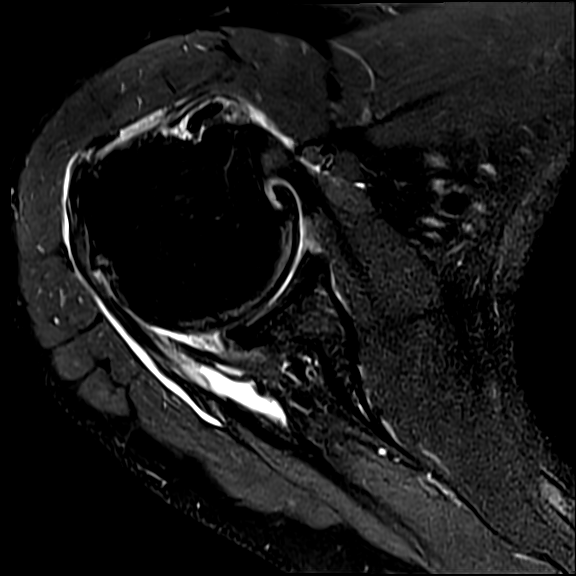
[im 10/22]
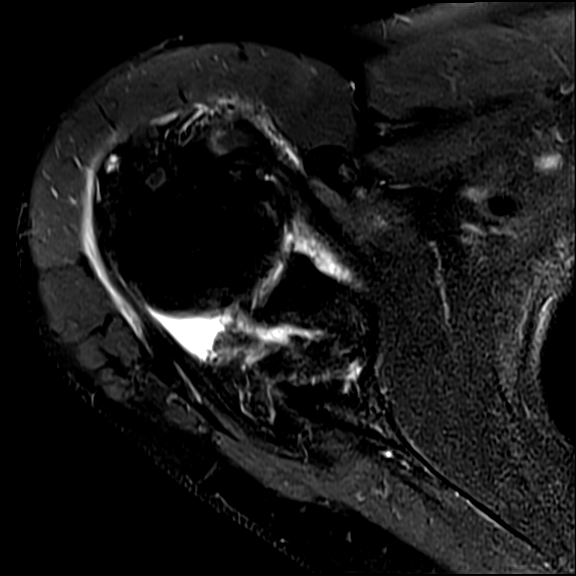
[im 13/22]
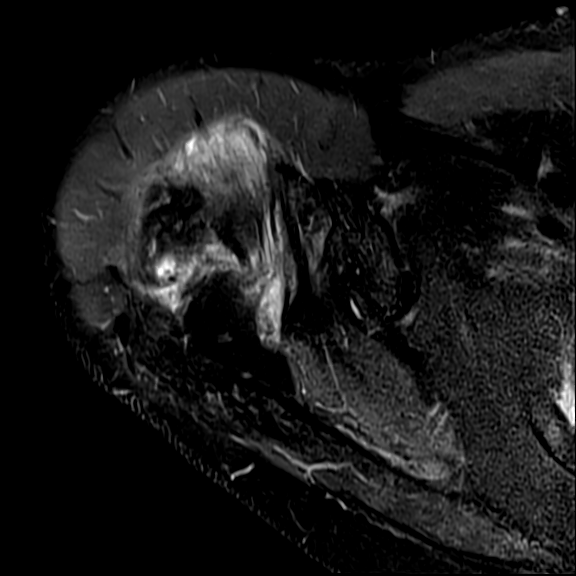
[im 19/22]
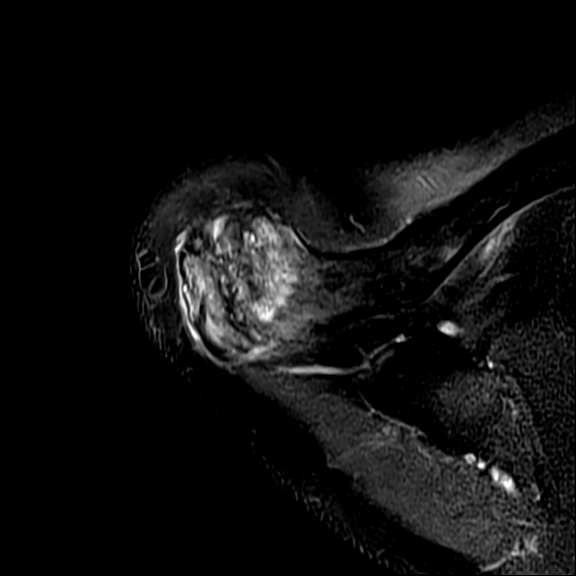

[Series 7: t2_cor_obl_fs · oblique · right · 3.0mm · 0.25mm/px · 3 of 18 slices shown]
[im 3/18]
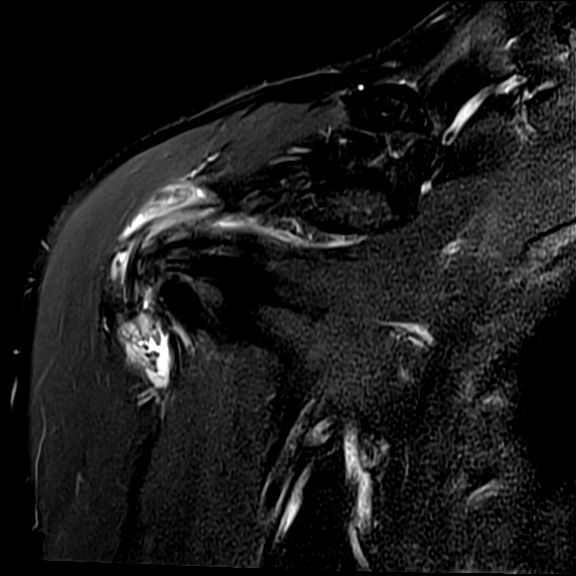
[im 10/18]
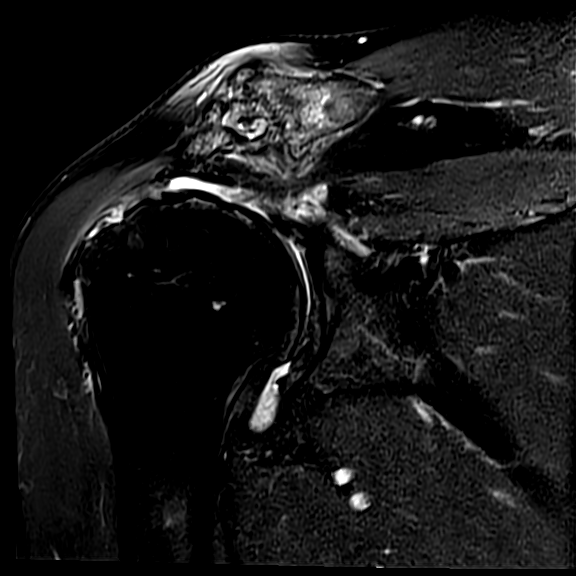
[im 15/18]
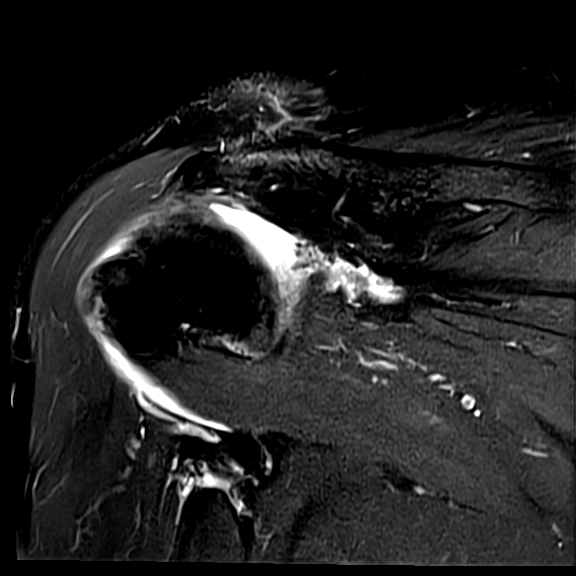

[Series 8: t2_sag_obl_fs · oblique · right · 3.0mm · 0.28mm/px · 3 of 28 slices shown]
[im 5/28]
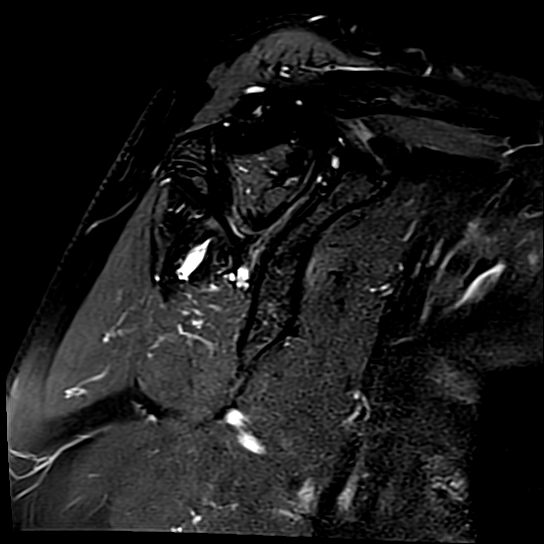
[im 15/28]
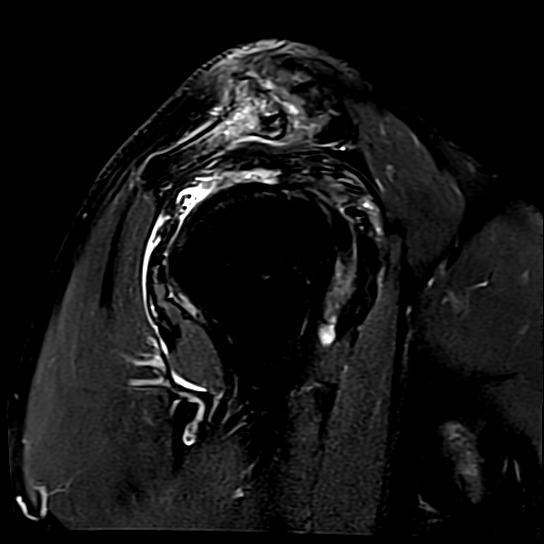
[im 25/28]
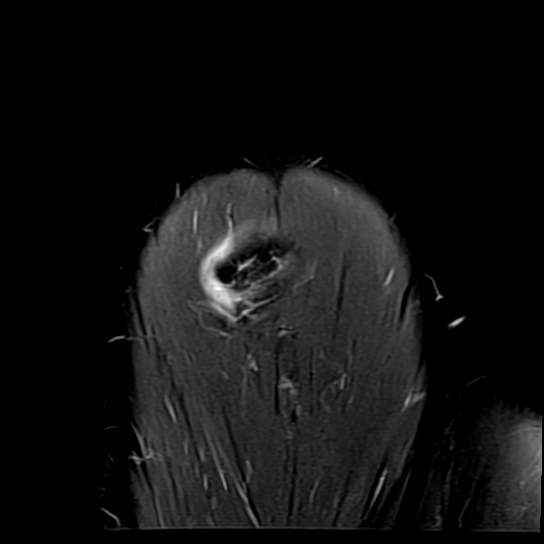

[Series 9: t1_sag_obl · oblique · right · 3.0mm · 0.26mm/px · 3 of 28 slices shown]
[im 5/28]
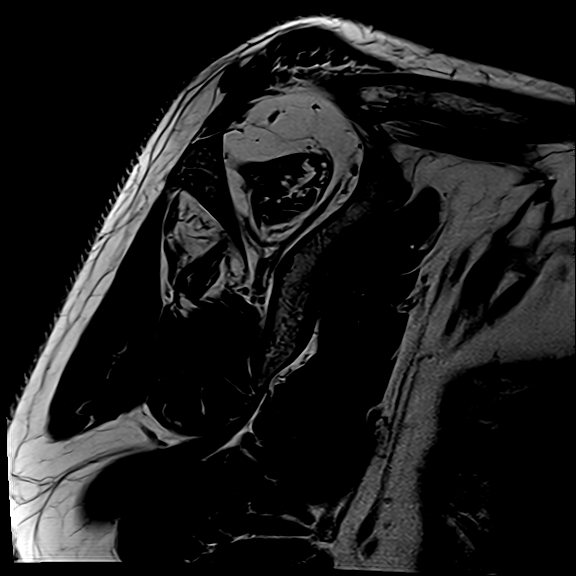
[im 15/28]
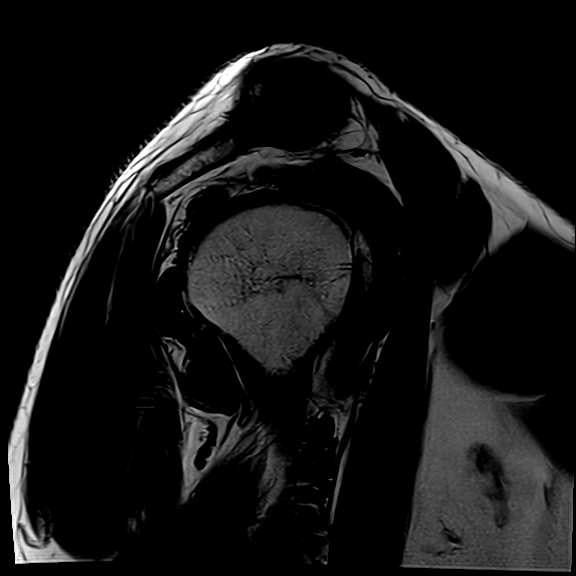
[im 25/28]
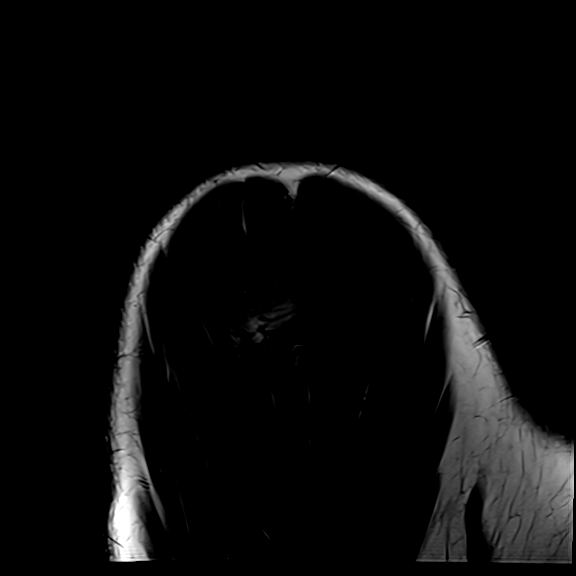

[15 of 40 positions shown; findings below may reference images not displayed]

FINDINGS: OSSEOUS: No acute fracture, avascular necrosis or aggressive osseous lesion.
ACROMIAL OUTLET:
Acromioclavicular joint osteoarthritis: Moderate to severe.
Acromion: Non-hooked.
Subacromial/subdeltoid bursa fluid: Small volume fluid, which decompresses from the glenohumeral joint through full-thickness rotator cuff tear.
Coracoclavicular and coracoacromial ligaments: Intact.
ROTATOR CUFF TENDONS:
Supraspinatus and infraspinatus: Redemonstration of complete tear of the supraspinatus tendon. Redemonstration of large full-thickness tear of the infraspinatus tendon, with a few the posterior
fibers remaining intact. Medial tendon retraction to the level of the superior glenoid.
Teres minor: Intact.
Subscapularis: Moderate tendinosis.
ROTATOR CUFF MUSCLES: Mild to moderate supraspinatus and moderate infraspinatus muscle atrophy, similar prior examination. Subscapularis and teres minor muscles are maintained.
LABRUM: Degeneration and tearing of the superior and inferior labrum.
BICEPS TENDON: Redemonstration of severe proximal intra-articular long head biceps tendinosis, with coexisting tearing in the rotator interval extending into the intertubercular groove. Medial
perching of the long head biceps tendon at the level of the superior intertubercular groove, unchanged.
GLENOHUMERAL JOINT: Similar appearance of moderate to severe glenohumeral joint chondrosis most pronounced involving the superior aspect of the humeral head. Small glenohumeral joint effusion, with
synovitis in the axillary recess.
OTHER: The inferior glenohumeral ligament is unremarkable.
IMPRESSION: 1.  Redemonstration of complete tear of the supraspinatus tendon and large full-thickness tear of the infraspinatus tendon. Mild to moderate supraspinatus and moderate infraspinatus muscle atrophy,
unchanged.
2.  Moderate subscapularis tendinosis, unchanged.
3.  Severe proximal intra-articular long head biceps tendinosis, with coexisting tearing in the rotator interval extending into the intertubercular groove. Medial perching of the long head biceps
tendon at the level of the superior intertubercular groove.
4.  Moderate to severe glenohumeral joint chondrosis, with degeneration and tearing of the superior and inferior labrum.
5.  Moderate to severe acromioclavicular joint osteoarthritis.

## 2022-08-09 IMAGING — CT CT SHOULDER RT WO CONTRAST
3 series · 13 of 35 positions shown, 16 images · non-contrast
Comparison: None

Images Obtained from [HOSPITAL] Imaging
Noncontrast CT of the right shoulder
INDICATION: Post-traumatic osteoarthritis, right shoulder
TECHNIQUE: Noncontrast CT of the right shoulder, EXACTECH protocol. Coronal and sagittal reformats provided.
Dose reduction technique used: Automated exposure control and adjustment of the mA and/or kV according to patient size.  CT and cardiac nuclear medicine studies performed in the previous 12 months =
0

[Series 3: soft tissue · axial · 0.43mm/px · z∈[-1125,-977]mm · 5 of 267 slices shown, 7 images]
[im 41/267  soft-tissue]
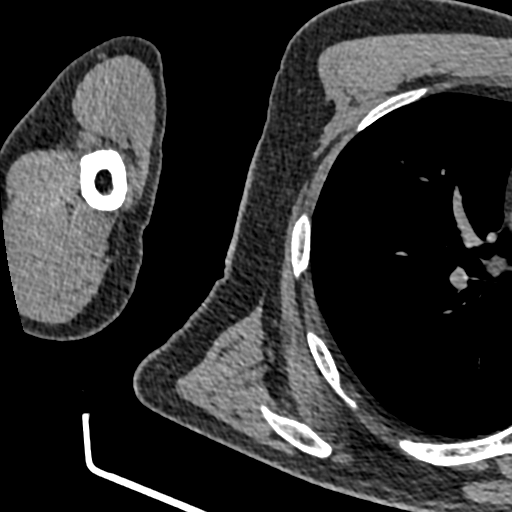
[im 41/267  bone]
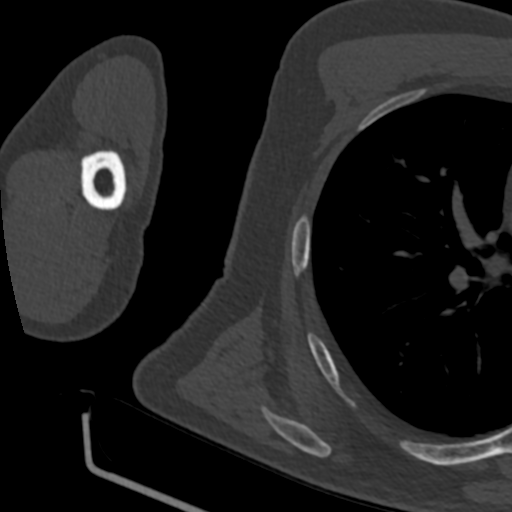
[im 82/267  bone]
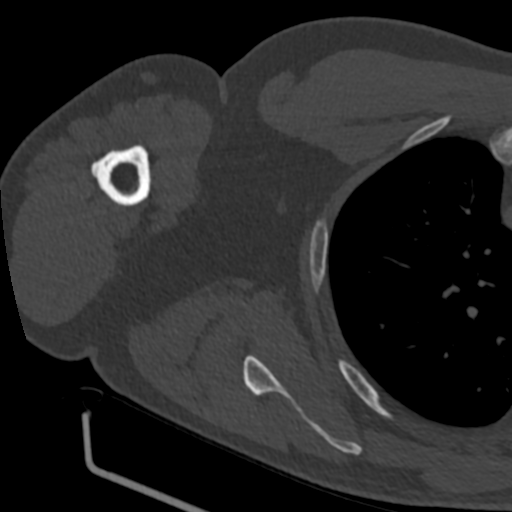
[im 144/267  bone]
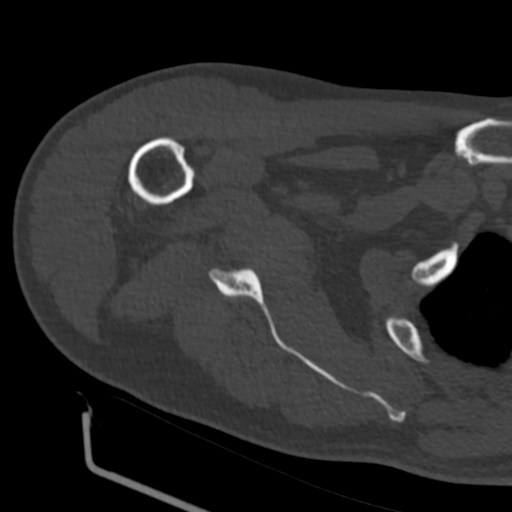
[im 185/267  bone]
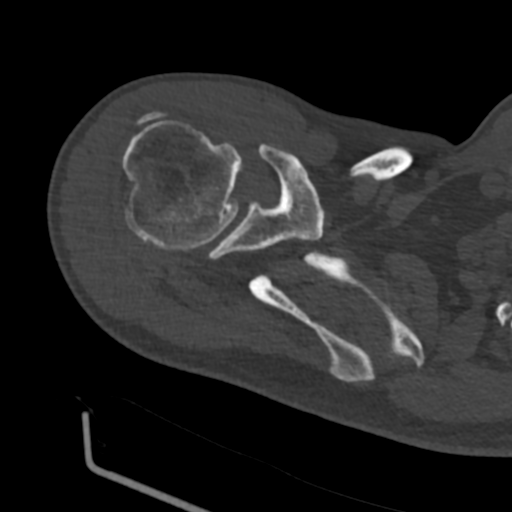
[im 226/267  soft-tissue]
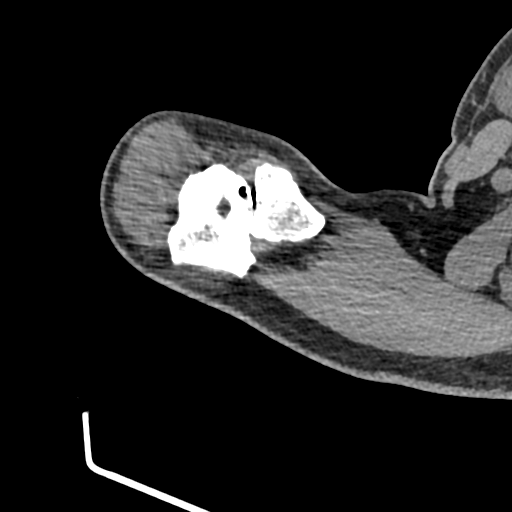
[im 226/267  bone]
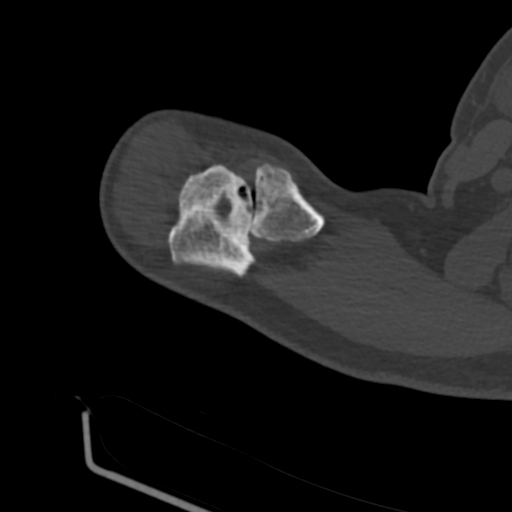

[Series 5: coronal bone · coronal · 0.42mm/px · 3 of 71 slices shown]
[im 15/71  bone]
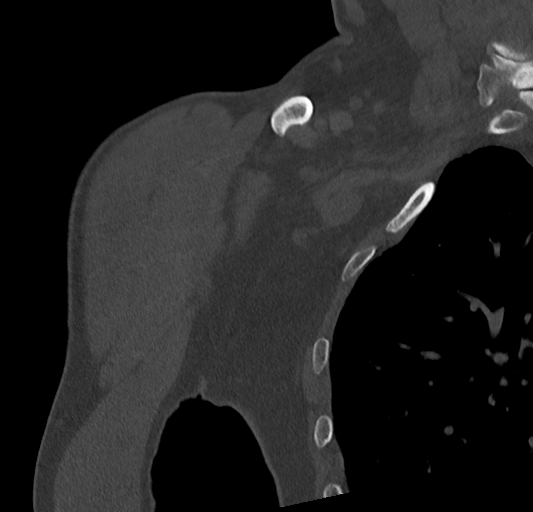
[im 29/71  bone]
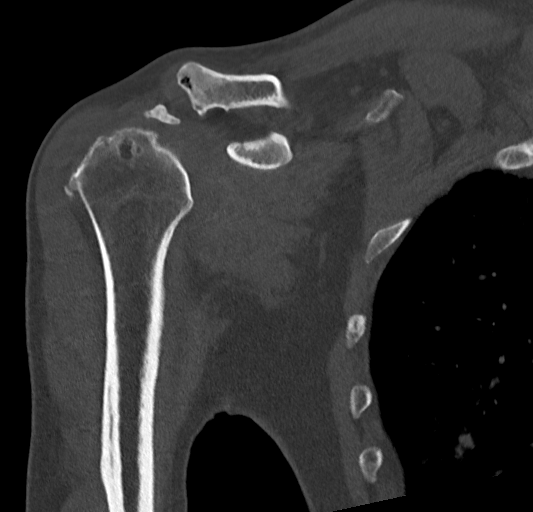
[im 43/71  bone]
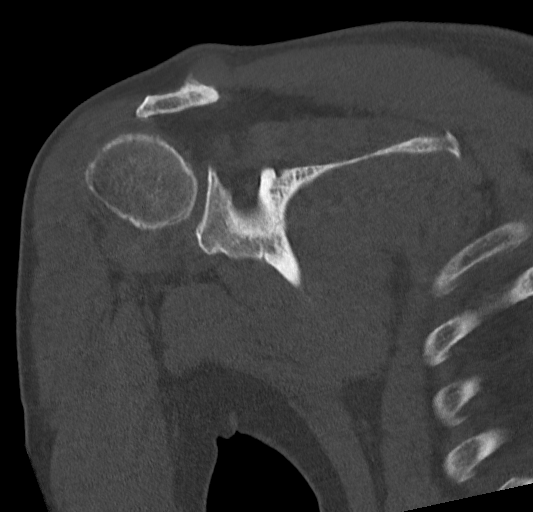

[Series 7: sagittal bone · sagittal · 0.28mm/px · 5 of 111 slices shown, 6 images]
[im 37/111  bone]
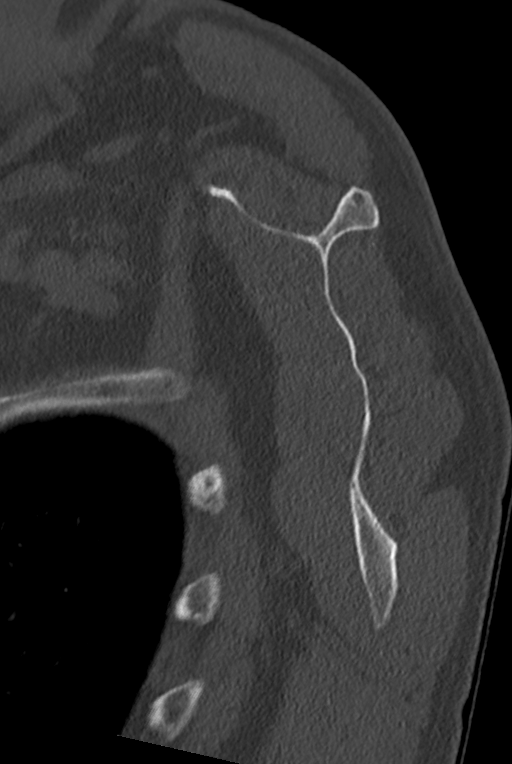
[im 46/111  bone]
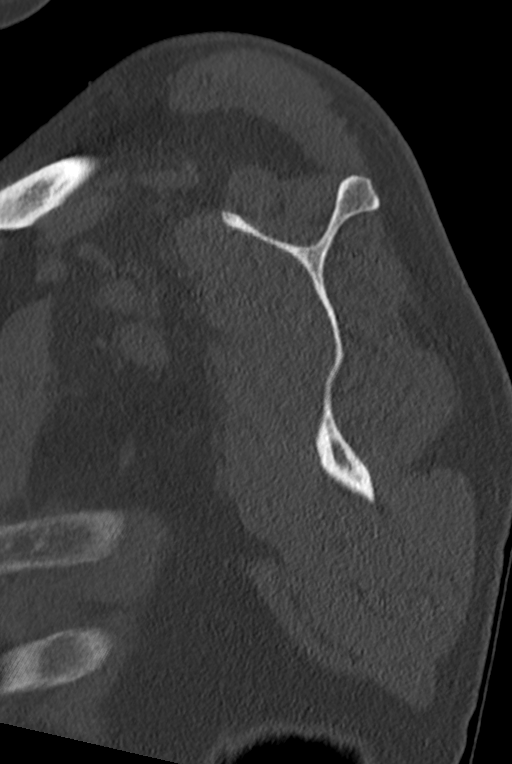
[im 56/111  soft-tissue]
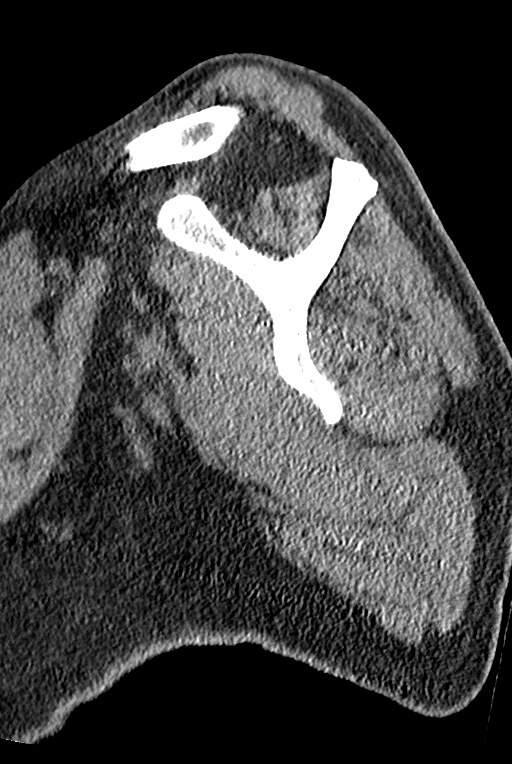
[im 56/111  bone]
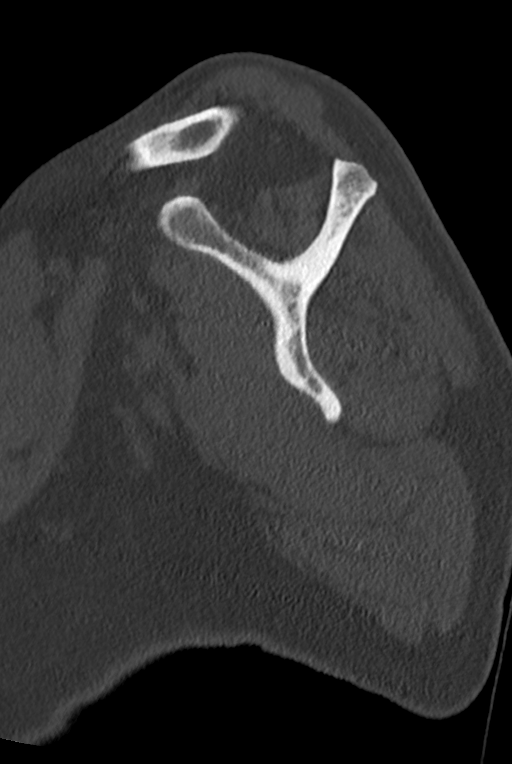
[im 65/111  bone]
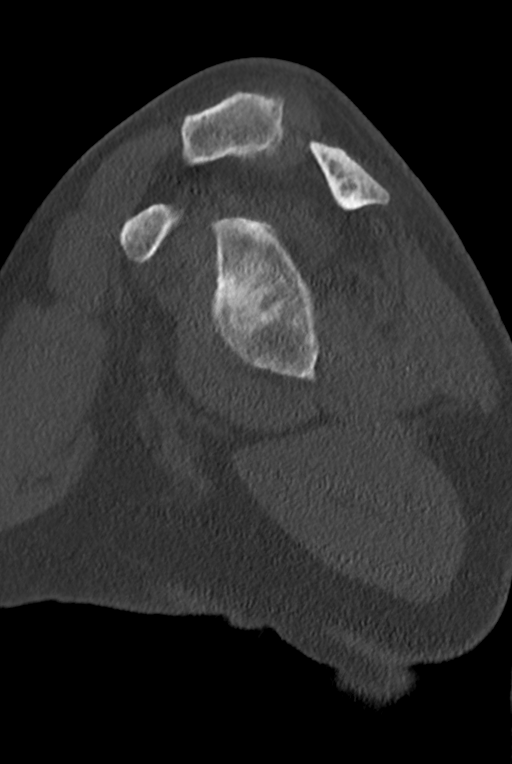
[im 74/111  bone]
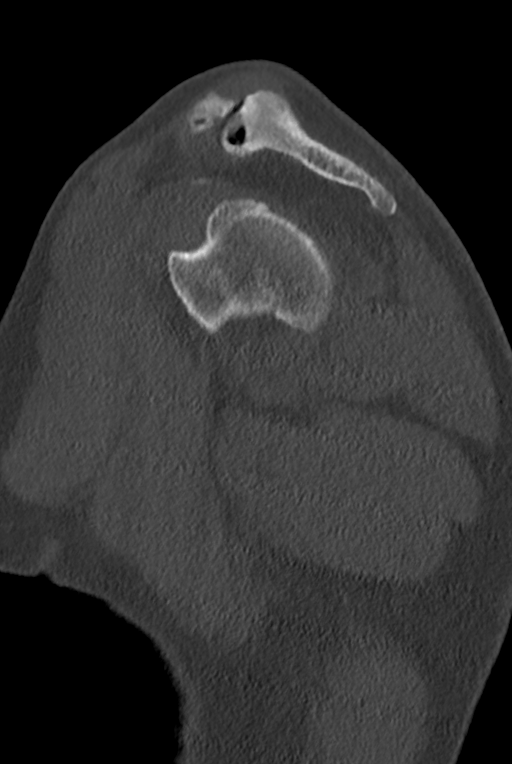

[13 of 35 positions shown; findings below may reference images not displayed]

FINDINGS: No acute fracture. No dislocation. Severe glenohumeral and AC joint DJD. Large spurs underlie the AC joint. Superior migration of the humeral head with pseudoarthrosis on the acromion. Osteopenia. No
suspicious lytic or blastic osseous lesion. No erosion. Mild posterior subluxation of the humeral head.
Moderate atrophy of the supraspinatus and infraspinatus muscles. No fluid collection. No soft tissue mass. No obvious glenohumeral joint effusion. No right axillary lymphadenopathy.
Small consolidation in the lower segment of the right upper lobe, worrisome for pneumonia.
IMPRESSION: 1.  Severe glenohumeral and AC joint DJD.
2.  Superior migration of the humeral head with pseudoarthrosis on the acromion, compatible with chronic full-thickness rotator cuff tear.
3.  Moderate atrophy of the supraspinatus and infraspinatus muscles.
4.  Small consolidation in the lower segment of the right upper lobe, worrisome for pneumonia.
Total radiation dose to patient is CTDIvol 7.93 mGy and DLP 192.00 mGy-cm.

## 2023-11-23 ENCOUNTER — Other Ambulatory Visit: Payer: Self-pay

## 2024-02-25 ENCOUNTER — Other Ambulatory Visit: Payer: Self-pay
